# Patient Record
Sex: Male | Born: 1975
Health system: Southern US, Community
[De-identification: ages and names within clinical notes are randomized; demographics above are authoritative.]

## PROBLEM LIST (undated history)

## (undated) DIAGNOSIS — I471 Supraventricular tachycardia, unspecified: Secondary | ICD-10-CM

## (undated) DIAGNOSIS — E785 Hyperlipidemia, unspecified: Secondary | ICD-10-CM

## (undated) DIAGNOSIS — I10 Essential (primary) hypertension: Secondary | ICD-10-CM

## (undated) DIAGNOSIS — F419 Anxiety disorder, unspecified: Secondary | ICD-10-CM

## (undated) HISTORY — DX: Essential (primary) hypertension: I10

## (undated) HISTORY — DX: Supraventricular tachycardia, unspecified: I47.10

## (undated) HISTORY — DX: Supraventricular tachycardia: I47.1

## (undated) HISTORY — DX: Hyperlipidemia, unspecified: E78.5

---

## 2012-03-12 ENCOUNTER — Ambulatory Visit: Payer: Self-pay

## 2017-08-31 ENCOUNTER — Ambulatory Visit: Payer: Self-pay | Admitting: Family Medicine

## 2017-09-19 ENCOUNTER — Ambulatory Visit (INDEPENDENT_AMBULATORY_CARE_PROVIDER_SITE_OTHER): Payer: BLUE CROSS/BLUE SHIELD | Admitting: Family Medicine

## 2017-09-19 ENCOUNTER — Encounter: Payer: Self-pay | Admitting: Family Medicine

## 2017-09-19 VITALS — BP 155/115 | HR 88 | Temp 98.5°F | Ht 66.2 in | Wt 212.5 lb

## 2017-09-19 DIAGNOSIS — E782 Mixed hyperlipidemia: Secondary | ICD-10-CM | POA: Insufficient documentation

## 2017-09-19 DIAGNOSIS — R079 Chest pain, unspecified: Secondary | ICD-10-CM

## 2017-09-19 DIAGNOSIS — I10 Essential (primary) hypertension: Secondary | ICD-10-CM | POA: Diagnosis not present

## 2017-09-19 DIAGNOSIS — E669 Obesity, unspecified: Secondary | ICD-10-CM | POA: Diagnosis not present

## 2017-09-19 DIAGNOSIS — R002 Palpitations: Secondary | ICD-10-CM

## 2017-09-19 LAB — UA/M W/RFLX CULTURE, ROUTINE
BILIRUBIN UA: NEGATIVE
GLUCOSE, UA: NEGATIVE
Ketones, UA: NEGATIVE
NITRITE UA: NEGATIVE
Protein, UA: NEGATIVE
RBC, UA: NEGATIVE
SPEC GRAV UA: 1.015 (ref 1.005–1.030)
Urobilinogen, Ur: 0.2 mg/dL (ref 0.2–1.0)
pH, UA: 7 (ref 5.0–7.5)

## 2017-09-19 LAB — MICROALBUMIN, URINE WAIVED
Creatinine, Urine Waived: 100 mg/dL (ref 10–300)
Microalb, Ur Waived: 30 mg/L — ABNORMAL HIGH (ref 0–19)

## 2017-09-19 LAB — MICROSCOPIC EXAMINATION: Bacteria, UA: NONE SEEN

## 2017-09-19 LAB — BAYER DCA HB A1C WAIVED: HB A1C: 5.6 % (ref <7.0)

## 2017-09-19 MED ORDER — HYDROCHLOROTHIAZIDE 25 MG PO TABS: 25.0000 mg | ORAL_TABLET | Freq: Every day | ORAL | 1 refills | Status: DC

## 2017-09-19 MED ORDER — AMLODIPINE BESYLATE 10 MG PO TABS: 10.0000 mg | ORAL_TABLET | Freq: Every day | ORAL | 3 refills | Status: DC

## 2017-09-19 NOTE — Progress Notes (Signed)
BP (!) 155/115 (BP Location: Left Arm, Cuff Size: Large)   Pulse 88   Temp 98.5 F (36.9 C) (Oral)   Ht 5' 6.2" (1.681 m)   Wt 212 lb 8 oz (96.4 kg)   SpO2 92%   BMI 34.09 kg/m    Subjective:    Patient ID: Clarence Ray, male    DOB: 09/07/1975, 42 y.o.   MRN: [id number]  HPI: Clarence E Gambrel is a 42 y.o. male  Chief Complaint  Patient presents with  . Hypertension  . Hyperlipidemia  . Establish Care   HYPERTENSION / HYPERLIPIDEMIA- has been having issues with his BP for about 3 months. Hadn't been seeing a doctor prior to that. Has been concerned about his blood pressure medicines. Has been working on losing weight and exercise, but since going on the losartan. Has been gaining weight. Not happy about that.  Satisfied with current treatment? no Duration of hypertension: months BP monitoring frequency: a few times a week BP range: 120s-150s/80s-110s BP medication side effects: yes, weight gain, memory has been off  Past BP meds: losartan, hctz Duration of hyperlipidemia: unknown Cholesterol medication side effects: Not on anything Cholesterol supplements: none Past cholesterol medications: atorvastatin- stopped taking it the week after Thanksgiving- made him feel weird, heart race Medication compliance: excellent compliance Aspirin: no Recent stressors: no Recurrent headaches: yes Visual changes: no Palpitations: yes Dyspnea: no Chest pain: yes Lower extremity edema: no Dizzy/lightheaded: yes  Active Ambulatory Problems    Diagnosis Date Noted  . Hyperlipidemia   . Hypertension   . Obesity (BMI 30-39.9) 09/19/2017   Resolved Ambulatory Problems    Diagnosis Date Noted  . No Resolved Ambulatory Problems   Past Medical History:  Diagnosis Date  . Hyperlipidemia   . Hypertension    History reviewed. No pertinent surgical history.  Outpatient Encounter Medications as of 09/19/2017  Medication Sig  . hydrochlorothiazide (HYDRODIURIL) 25 MG tablet Take 1  tablet (25 mg total) by mouth daily.  . [DISCONTINUED] hydrochlorothiazide (HYDRODIURIL) 25 MG tablet Take 25 mg by mouth daily.  . [DISCONTINUED] losartan (COZAAR) 50 MG tablet Take 1 tablet by mouth daily.  Marland Kitchen. amLODipine (NORVASC) 10 MG tablet Take 1 tablet (10 mg total) by mouth daily.   No facility-administered encounter medications on file as of 09/19/2017.    Allergies  Allergen Reactions  . Atorvastatin Other (See Comments)    Made him "feel weird"  . Losartan Other (See Comments)    Made him "feel weird"    Family History  Problem Relation Age of Onset  . Heart disease Mother   . Hypertension Mother   . Fibromyalgia Sister   . Anxiety disorder Sister   . Stroke Paternal Grandmother   . Fibromyalgia Sister     Social History   Socioeconomic History  . Marital status: Married    Spouse name: Not on file  . Number of children: Not on file  . Years of education: Not on file  . Highest education level: Not on file  Social Needs  . Financial resource strain: Not on file  . Food insecurity - worry: Not on file  . Food insecurity - inability: Not on file  . Transportation needs - medical: Not on file  . Transportation needs - non-medical: Not on file  Occupational History  . Not on file  Tobacco Use  . Smoking status: Former Smoker    Last attempt to quit: 06/19/2017    Years since quitting: 0.2  .  Smokeless tobacco: Never Used  Substance and Sexual Activity  . Alcohol use: Yes    Comment: on occasion  . Drug use: No  . Sexual activity: Yes  Other Topics Concern  . Not on file  Social History Narrative  . Not on file     Review of Systems  Constitutional: Negative.   Respiratory: Positive for shortness of breath. Negative for apnea, cough, choking, chest tightness, wheezing and stridor.   Cardiovascular: Positive for chest pain and palpitations. Negative for leg swelling.  Neurological: Negative.     Per HPI unless specifically indicated above       Objective:    BP (!) 155/115 (BP Location: Left Arm, Cuff Size: Large)   Pulse 88   Temp 98.5 F (36.9 C) (Oral)   Ht 5' 6.2" (1.681 m)   Wt 212 lb 8 oz (96.4 kg)   SpO2 92%   BMI 34.09 kg/m   Wt Readings from Last 3 Encounters:  09/19/17 212 lb 8 oz (96.4 kg)    Physical Exam  Constitutional: He is oriented to person, place, and time. He appears well-developed and well-nourished. No distress.  HENT:  Head: Normocephalic and atraumatic.  Right Ear: Hearing normal.  Left Ear: Hearing normal.  Nose: Nose normal.  Eyes: Conjunctivae and lids are normal. Right eye exhibits no discharge. Left eye exhibits no discharge. No scleral icterus.  Cardiovascular: Normal rate, regular rhythm, normal heart sounds and intact distal pulses. Exam reveals no gallop and no friction rub.  No murmur heard. Pulmonary/Chest: Effort normal and breath sounds normal. No respiratory distress. He has no wheezes. He has no rales. He exhibits no tenderness.  Musculoskeletal: Normal range of motion.  Neurological: He is alert and oriented to person, place, and time.  Skin: Skin is warm, dry and intact. No rash noted. He is not diaphoretic. No erythema. No pallor.  Psychiatric: He has a normal mood and affect. His speech is normal and behavior is normal. Judgment and thought content normal. Cognition and memory are normal.  Nursing note and vitals reviewed.   No results found for this or any previous visit.    Assessment & Plan:   Problem List Items Addressed This Visit      Cardiovascular and Mediastinum   Hypertension - Primary    Not under good control, not tolerating losartan. Will change to amlodipine. Continue HCTZ and recheck 2 weeks. Will get renal US to R/O RAS given new dx and difficult to control. Await results.       Relevant Medications   amLODipine (NORVASC) 10 MG tablet   hydrochlorothiazide (HYDRODIURIL) 25 MG tablet   Other Relevant Orders   US Renal Artery Stenosis   CBC with  Differential/Platelet   Comprehensive metabolic panel   Microalbumin, Urine Waived   TSH   UA/M w/rflx Culture, Routine     Other   Hyperlipidemia    Stopped atorvastatin as it made him feel weird. Will recheck levels and treat as needed.       Relevant Medications   amLODipine (NORVASC) 10 MG tablet   hydrochlorothiazide (HYDRODIURIL) 25 MG tablet   Other Relevant Orders   CBC with Differential/Platelet   Comprehensive metabolic panel   Lipid Panel w/o Chol/HDL Ratio   TSH   UA/M w/rflx Culture, Routine   Obesity (BMI 30-39.9)    Encouraged diet and exercise. Call with any concerns.       Relevant Orders   CBC with Differential/Platelet   Bayer DCA Hb  A1c Waived   Comprehensive metabolic panel   TSH   UA/M w/rflx Culture, Routine    Other Visit Diagnoses    Palpitations       EKG showed no ST changes. Will check labs and obtain holter monitor. Get BP under control. Recheck 2 weeks.    Relevant Orders   EKG 12-Lead (Completed)   Holter monitor - 24 hour   CBC with Differential/Platelet   Comprehensive metabolic panel   TSH   UA/M w/rflx Culture, Routine   Chest pain, unspecified type       EKG showed no ST changes. Will obtain holter monitor. Get BP under control. Call with concerns- recheck 2 weeks.    Relevant Orders   EKG 12-Lead (Completed)   CBC with Differential/Platelet   Comprehensive metabolic panel   TSH   UA/M w/rflx Culture, Routine       Follow up plan: Return 2 weeks, for follow up BP.

## 2017-09-19 NOTE — Assessment & Plan Note (Signed)
Not under good control, not tolerating losartan. Will change to amlodipine. Continue HCTZ and recheck 2 weeks. Will get renal US to R/O RAS given new dx and difficult to control. Await results.

## 2017-09-19 NOTE — Assessment & Plan Note (Signed)
Stopped atorvastatin as it made him feel weird. Will recheck levels and treat as needed.

## 2017-09-19 NOTE — Assessment & Plan Note (Signed)
Encouraged diet and exercise. Call with any concerns.  

## 2017-09-19 NOTE — Patient Instructions (Addendum)
Managing Your Hypertension Hypertension is commonly called high blood pressure. This is when the force of your blood pressing against the walls of your arteries is too strong. Arteries are blood vessels that carry blood from your heart throughout your body. Hypertension forces the heart to work harder to pump blood, and may cause the arteries to become narrow or stiff. Having untreated or uncontrolled hypertension can cause heart attack, stroke, kidney disease, and other problems. What are blood pressure readings? A blood pressure reading consists of a higher number over a lower number. Ideally, your blood pressure should be below 120/80. The first ("top") number is called the systolic pressure. It is a measure of the pressure in your arteries as your heart beats. The second ("bottom") number is called the diastolic pressure. It is a measure of the pressure in your arteries as the heart relaxes. What does my blood pressure reading mean? Blood pressure is classified into four stages. Based on your blood pressure reading, your health care provider may use the following stages to determine what type of treatment you need, if any. Systolic pressure and diastolic pressure are measured in a unit called mm Hg. Normal  Systolic pressure: below 120.  Diastolic pressure: below 80. Elevated  Systolic pressure: 120-129.  Diastolic pressure: below 80. Hypertension stage 1  Systolic pressure: 130-139.  Diastolic pressure: 80-89. Hypertension stage 2  Systolic pressure: 140 or above.  Diastolic pressure: 90 or above. What health risks are associated with hypertension? Managing your hypertension is an important responsibility. Uncontrolled hypertension can lead to:  A heart attack.  A stroke.  A weakened blood vessel (aneurysm).  Heart failure.  Kidney damage.  Eye damage.  Metabolic syndrome.  Memory and concentration problems.  What changes can I make to manage my  hypertension? Hypertension can be managed by making lifestyle changes and possibly by taking medicines. Your health care provider will help you make a plan to bring your blood pressure within a normal range. Eating and drinking  Eat a diet that is high in fiber and potassium, and low in salt (sodium), added sugar, and fat. An example eating plan is called the DASH (Dietary Approaches to Stop Hypertension) diet. To eat this way: ? Eat plenty of fresh fruits and vegetables. Try to fill half of your plate at each meal with fruits and vegetables. ? Eat whole grains, such as whole wheat pasta, brown rice, or whole grain bread. Fill about one quarter of your plate with whole grains. ? Eat low-fat diary products. ? Avoid fatty cuts of meat, processed or cured meats, and poultry with skin. Fill about one quarter of your plate with lean proteins such as fish, chicken without skin, beans, eggs, and tofu. ? Avoid premade and processed foods. These tend to be higher in sodium, added sugar, and fat.  Reduce your daily sodium intake. Most people with hypertension should eat less than 1,500 mg of sodium a day.  Limit alcohol intake to no more than 1 drink a day for nonpregnant women and 2 drinks a day for men. One drink equals 12 oz of beer, 5 oz of wine, or 1 oz of hard liquor. Lifestyle  Work with your health care provider to maintain a healthy body weight, or to lose weight. Ask what an ideal weight is for you.  Get at least 30 minutes of exercise that causes your heart to beat faster (aerobic exercise) most days of the week. Activities may include walking, swimming, or biking.  Include exercise   to strengthen your muscles (resistance exercise), such as weight lifting, as part of your weekly exercise routine. Try to do these types of exercises for 30 minutes at least 3 days a week.  Do not use any products that contain nicotine or tobacco, such as cigarettes and e-cigarettes. If you need help quitting, ask  your health care provider.  Control any long-term (chronic) conditions you have, such as high cholesterol or diabetes. Monitoring  Monitor your blood pressure at home as told by your health care provider. Your personal target blood pressure may vary depending on your medical conditions, your age, and other factors.  Have your blood pressure checked regularly, as often as told by your health care provider. Working with your health care provider  Review all the medicines you take with your health care provider because there may be side effects or interactions.  Talk with your health care provider about your diet, exercise habits, and other lifestyle factors that may be contributing to hypertension.  Visit your health care provider regularly. Your health care provider can help you create and adjust your plan for managing hypertension. Will I need medicine to control my blood pressure? Your health care provider may prescribe medicine if lifestyle changes are not enough to get your blood pressure under control, and if:  Your systolic blood pressure is 130 or higher.  Your diastolic blood pressure is 80 or higher.  Take medicines only as told by your health care provider. Follow the directions carefully. Blood pressure medicines must be taken as prescribed. The medicine does not work as well when you skip doses. Skipping doses also puts you at risk for problems. Contact a health care provider if:  You think you are having a reaction to medicines you have taken.  You have repeated (recurrent) headaches.  You feel dizzy.  You have swelling in your ankles.  You have trouble with your vision. Get help right away if:  You develop a severe headache or confusion.  You have unusual weakness or numbness, or you feel faint.  You have severe pain in your chest or abdomen.  You vomit repeatedly.  You have trouble breathing. Summary  Hypertension is when the force of blood pumping through  your arteries is too strong. If this condition is not controlled, it may put you at risk for serious complications.  Your personal target blood pressure may vary depending on your medical conditions, your age, and other factors. For most people, a normal blood pressure is less than 120/80.  Hypertension is managed by lifestyle changes, medicines, or both. Lifestyle changes include weight loss, eating a healthy, low-sodium diet, exercising more, and limiting alcohol. This information is not intended to replace advice given to you by your health care provider. Make sure you discuss any questions you have with your health care provider. Document Released: 05/09/2012 Document Revised: 07/13/2016 Document Reviewed: 07/13/2016 Elsevier Interactive Patient Education  2018 [employer]  Renovascular Hypertension Renovascular hypertension is high blood pressure that happens when the arteries that carry blood to the kidneys (renal arteries) become narrow. Blood pressure is a measurement of how strongly your blood is pressing against the walls of your arteries. Hypertension, or high blood pressure, is a long-term condition in which blood pressure is higher than normal. Untreated hypertension, including renovascular hypertension, can lead to various health problems, such as:  Heart failure.  Heart disease.  Stroke.  Kidney failure.  Blood vessel damage.  Blindness or vision problems.  What are the causes? This  condition occurs when one or both of the renal arteries become narrow. This narrowing reduces blood flow to the kidneys, causing the kidneys to sense that blood pressure is low. As a result, the kidneys make an enzyme called renin that causes an increase in blood pressure. Many conditions can cause the renal arteries to become narrow and lead to renovascular hypertension. Some of these are:  Atherosclerosis. This is a hardening of the renal arteries. It causes plaque to build up and block  the renal arteries.  Fibromuscular dysplasia. This is a condition in which cells of the artery wall overgrow, causing a narrowing of the renal arteries. It is a common cause of renovascular hypertension in younger women.  A blockage in the renal artery due to injury, tumors, or blood clots (rare).  What increases the risk? You are more likely to develop this condition if:  You are a woman who is younger than age 25.  You are a man who is older than age 78.  You have a history of heart problems or strokes.  What are the signs or symptoms? In many cases, there are no symptoms. If symptoms are present, they may include:  Sudden high blood pressure that gets worse in older people who previously had well-controlled blood pressure.  Nausea and vomiting.  Vision problems.  Chest pain.  How is this diagnosed? This condition may be diagnosed based on:  A physical exam and blood pressure check. During the exam, your health care provider may use a stethoscope to listen for a "whooshing" noise (bruit) over the abdomen or on either side between the ribs and the hip (flank area).  Blood tests to measure renin and to check your hormone levels, including a hormone called aldosterone. Aldosterone controls the salt and water balance in your body.  Imaging tests. These may include: ? An ultrasound. This test uses sound waves to produce an image of the inside of your body. ? Renal angiogram. For this test, a dye is injected into a kidney artery to show narrowing of the artery on an X-ray. ? MRI of the arteries that supply the kidneys.  How is this treated? This condition may be treated with:  Medicines. These may be given to help you control your blood pressure.  Treatments or lifestyle changes to address factors or conditions that may be contributing to high blood pressure. This may mean lowering your cholesterol, eating a heart-healthy diet, exercising, and maintaining an ideal body  weight.  Surgery to remove a blockage. This may be necessary if a renal artery is blocked badly.  Percutaneous transluminal renal angioplasty (PTRA). This is a procedure to open narrow renal arteries if they are not completely blocked. Sometimes a stent is placed in the artery to prevent the artery from becoming blocked again.  Follow these instructions at home: Monitoring your blood pressure  Monitor your blood pressure at home as told by your health care provider. ? A blood pressure reading is recorded as two numbers, such as 120 over 80 (or 120/80). The first ("top") number is called the systolic pressure. It is a measure of the pressure in your arteries when your heart beats. The second ("bottom") number is called the diastolic pressure. It is a measure of the pressure in your arteries as your heart relaxes between beats. A normal blood pressure reading is:  Systolic: below 120.  Diastolic: below 80. ? Your personal target blood pressure may vary depending on your medical conditions, your age, and other  factors.  Have your blood pressure rechecked as told by your health care provider. Lifestyle  Work with your health care provider to maintain a healthy body weight or to lose weight. Ask what an ideal weight is for you.  Exercise regularly. Get at least 30-45 minutes of aerobic exercise, at least 5 times a week.  Do not use any products that contain nicotine or tobacco, such as cigarettes and e-cigarettes. If you need help quitting, ask your health care provider. Eating and drinking  Eat a heart-healthy diet. This may include: ? Following the DASH diet. This diet is high in fruits, vegetables, and whole grains. It is low in salt (sodium), saturated fat, and added sugars. ? Keeping your sodium intake below 1,500 mg per day. Do not add salt to your food. Check food labels to see how much sodium is in a food or beverage.  Limit alcohol intake to no more than 1 drink a day for  nonpregnant women and 2 drinks a day for men. One drink equals 12 oz of beer, 5 oz of wine, or 1 oz of hard liquor.  Try to limit caffeine. Caffeine may make your renovascular hypertension worse. Check ingredients and nutrition facts to see if a food or beverage contains caffeine. General instructions  Take over-the-counter and prescription medicines only as told by your health care provider.  Keep all follow-up visits as told by your health care provider. This is important. Contact a health care provider if:  Your symptoms continue to get worse and medicine does not help.  You have a fever. Get help right away if:  You develop shortness of breath.  You develop numbness on one side.  You develop areas of muscle weakness.  You are unable to speak.  You feel light-headed or you pass out.  You have sudden spikes of high blood pressure.  You have symptoms of very high blood pressure. Summary  Renovascular hypertension is high blood pressure that happens when the arteries that carry blood to the kidneys (renal arteries) become narrow.  In many cases, there are no symptoms for this condition.  There are several treatments for renovascular hypertension, including medicines, lifestyle changes, surgery to remove a blockage, or a procedure to widen a narrow artery.  Monitor your blood pressure at home as told by your health care provider. This information is not intended to replace advice given to you by your health care provider. Make sure you discuss any questions you have with your health care provider. Document Released: 07/29/2005 Document Revised: 07/20/2016 Document Reviewed: 07/20/2016 Elsevier Interactive Patient Education  2017 [employer] Amlodipine tablets What is this medicine? AMLODIPINE (am LOE di peen) is a calcium-channel blocker. It affects the amount of calcium found in your heart and muscle cells. This relaxes your blood vessels, which can reduce the amount of  work the heart has to do. This medicine is used to lower high blood pressure. It is also used to prevent chest pain. This medicine may be used for other purposes; ask your health care provider or pharmacist if you have questions. COMMON BRAND NAME(S): Norvasc What should I tell my health care provider before I take this medicine? They need to know if you have any of these conditions: -heart problems like heart failure or aortic stenosis -liver disease -an unusual or allergic reaction to amlodipine, other medicines, foods, dyes, or preservatives -pregnant or trying to get pregnant -breast-feeding How should I use this medicine? Take this medicine by mouth with a  glass of water. Follow the directions on the prescription label. Take your medicine at regular intervals. Do not take more medicine than directed. Talk to your pediatrician regarding the use of this medicine in children. Special care may be needed. This medicine has been used in children as young as 6. Persons over 15 years old may have a stronger reaction to this medicine and need smaller doses. Overdosage: If you think you have taken too much of this medicine contact a poison control center or emergency room at once. NOTE: This medicine is only for you. Do not share this medicine with others. What if I miss a dose? If you miss a dose, take it as soon as you can. If it is almost time for your next dose, take only that dose. Do not take double or extra doses. What may interact with this medicine? -herbal or dietary supplements -local or general anesthetics -medicines for high blood pressure -medicines for prostate problems -rifampin This list may not describe all possible interactions. Give your health care provider a list of all the medicines, herbs, non-prescription drugs, or dietary supplements you use. Also tell them if you smoke, drink alcohol, or use illegal drugs. Some items may interact with your medicine. What should I watch  for while using this medicine? Visit your doctor or health care professional for regular check ups. Check your blood pressure and pulse rate regularly. Ask your health care professional what your blood pressure and pulse rate should be, and when you should contact him or her. This medicine may make you feel confused, dizzy or lightheaded. Do not drive, use machinery, or do anything that needs mental alertness until you know how this medicine affects you. To reduce the risk of dizzy or fainting spells, do not sit or stand up quickly, especially if you are an older patient. Avoid alcoholic drinks; they can make you more dizzy. Do not suddenly stop taking amlodipine. Ask your doctor or health care professional how you can gradually reduce the dose. What side effects may I notice from receiving this medicine? Side effects that you should report to your doctor or health care professional as soon as possible: -allergic reactions like skin rash, itching or hives, swelling of the face, lips, or tongue -breathing problems -changes in vision or hearing -chest pain -fast, irregular heartbeat -swelling of legs or ankles Side effects that usually do not require medical attention (report to your doctor or health care professional if they continue or are bothersome): -dry mouth -facial flushing -nausea, vomiting -stomach gas, pain -tired, weak -trouble sleeping This list may not describe all possible side effects. Call your doctor for medical advice about side effects. You may report side effects to FDA at 1-800-FDA-1088. Where should I keep my medicine? Keep out of the reach of children. Store at room temperature between 59 and 86 degrees F (15 and 30 degrees C). Protect from light. Keep container tightly closed. Throw away any unused medicine after the expiration date. NOTE: This sheet is a summary. It may not cover all possible information. If you have questions about this medicine, talk to your doctor,  pharmacist, or health care provider.  2018 Elsevier/Gold Standard (2012-07-13 11:40:58)

## 2017-09-20 ENCOUNTER — Encounter: Payer: Self-pay | Admitting: Family Medicine

## 2017-09-20 LAB — CBC WITH DIFFERENTIAL/PLATELET
BASOS: 1 %
Basophils Absolute: 0 10*3/uL (ref 0.0–0.2)
EOS (ABSOLUTE): 0.2 10*3/uL (ref 0.0–0.4)
Eos: 3 %
Hematocrit: 46.2 % (ref 37.5–51.0)
Hemoglobin: 16 g/dL (ref 13.0–17.7)
Immature Grans (Abs): 0 10*3/uL (ref 0.0–0.1)
Immature Granulocytes: 0 %
Lymphocytes Absolute: 2.4 10*3/uL (ref 0.7–3.1)
Lymphs: 43 %
MCH: 31 pg (ref 26.6–33.0)
MCHC: 34.6 g/dL (ref 31.5–35.7)
MCV: 90 fL (ref 79–97)
MONOS ABS: 0.5 10*3/uL (ref 0.1–0.9)
Monocytes: 9 %
NEUTROS ABS: 2.5 10*3/uL (ref 1.4–7.0)
Neutrophils: 44 %
PLATELETS: 200 10*3/uL (ref 150–379)
RBC: 5.16 x10E6/uL (ref 4.14–5.80)
RDW: 14.1 % (ref 12.3–15.4)
WBC: 5.7 10*3/uL (ref 3.4–10.8)

## 2017-09-20 LAB — COMPREHENSIVE METABOLIC PANEL
ALT: 18 IU/L (ref 0–44)
AST: 15 IU/L (ref 0–40)
Albumin/Globulin Ratio: 1.8 (ref 1.2–2.2)
Albumin: 4.6 g/dL (ref 3.5–5.5)
Alkaline Phosphatase: 77 IU/L (ref 39–117)
BILIRUBIN TOTAL: 0.2 mg/dL (ref 0.0–1.2)
BUN/Creatinine Ratio: 12 (ref 9–20)
BUN: 12 mg/dL (ref 6–24)
CHLORIDE: 100 mmol/L (ref 96–106)
CO2: 27 mmol/L (ref 20–29)
Calcium: 9.3 mg/dL (ref 8.7–10.2)
Creatinine, Ser: 0.99 mg/dL (ref 0.76–1.27)
GFR calc non Af Amer: 95 mL/min/{1.73_m2} (ref >59)
GFR, EST AFRICAN AMERICAN: 110 mL/min/{1.73_m2} (ref >59)
Globulin, Total: 2.6 g/dL (ref 1.5–4.5)
Glucose: 114 mg/dL — ABNORMAL HIGH (ref 65–99)
POTASSIUM: 4.3 mmol/L (ref 3.5–5.2)
SODIUM: 142 mmol/L (ref 134–144)
TOTAL PROTEIN: 7.2 g/dL (ref 6.0–8.5)

## 2017-09-20 LAB — TSH: TSH: 0.806 u[IU]/mL (ref 0.450–4.500)

## 2017-09-20 LAB — LIPID PANEL W/O CHOL/HDL RATIO
CHOLESTEROL TOTAL: 194 mg/dL (ref 100–199)
HDL: 31 mg/dL — ABNORMAL LOW (ref >39)
LDL CALC: 127 mg/dL — AB (ref 0–99)
TRIGLYCERIDES: 180 mg/dL — AB (ref 0–149)
VLDL CHOLESTEROL CAL: 36 mg/dL (ref 5–40)

## 2017-09-22 NOTE — Telephone Encounter (Signed)
Please call [place] and see how he's doing- that is not an appropriate mychart message- for symptoms like that, he really should call. Thanks.

## 2017-09-25 NOTE — Telephone Encounter (Signed)
Called and left patient a VM asking for him to please return my call.  

## 2017-09-26 NOTE — Telephone Encounter (Signed)
Called patient, he states that he is feeling better. Asked patient that if this happens in the future to contact the office to schedule a follow up visit.

## 2017-10-04 ENCOUNTER — Encounter: Payer: Self-pay | Admitting: Family Medicine

## 2017-10-04 ENCOUNTER — Ambulatory Visit (INDEPENDENT_AMBULATORY_CARE_PROVIDER_SITE_OTHER): Payer: BLUE CROSS/BLUE SHIELD | Admitting: Family Medicine

## 2017-10-04 VITALS — BP 126/86 | HR 100 | Temp 98.8°F | Wt 213.4 lb

## 2017-10-04 DIAGNOSIS — I10 Essential (primary) hypertension: Secondary | ICD-10-CM | POA: Diagnosis not present

## 2017-10-04 DIAGNOSIS — R002 Palpitations: Secondary | ICD-10-CM

## 2017-10-04 NOTE — Patient Instructions (Addendum)
Palpitations A palpitation is the feeling that your heartbeat is irregular or is faster than normal. It may feel like your heart is fluttering or skipping a beat. Palpitations are usually not a serious problem. They may be caused by many things, including smoking, caffeine, alcohol, stress, and certain medicines. Although most causes of palpitations are not serious, palpitations can be a sign of a serious medical problem. In some cases, you may need further medical evaluation. Follow these instructions at home: Pay attention to any changes in your symptoms. Take these actions to help with your condition:  Avoid the following: ? Caffeinated coffee, tea, soft drinks, diet pills, and energy drinks. ? Chocolate. ? Alcohol.  Do not use any tobacco products, such as cigarettes, chewing tobacco, and e-cigarettes. If you need help quitting, ask your health care provider.  Try to reduce your stress and anxiety. Things that can help you relax include: ? Yoga. ? Meditation. ? Physical activity, such as swimming, jogging, or walking. ? Biofeedback. This is a method that helps you learn to use your mind to control things in your body, such as your heartbeats.  Get plenty of rest and sleep.  Take over-the-counter and prescription medicines only as told by your health care provider.  Keep all follow-up visits as told by your health care provider. This is important.  Contact a health care provider if:  You continue to have a fast or irregular heartbeat after 24 hours.  Your palpitations occur more often. Get help right away if:  You have chest pain or shortness of breath.  You have a severe headache.  You feel dizzy or you faint. This information is not intended to replace advice given to you by your health care provider. Make sure you discuss any questions you have with your health care provider. Document Released: 08/12/2000 Document Revised: 01/18/2016 Document Reviewed: 04/30/2015 Elsevier  Interactive Patient Education  2018 [employer] Inc.  

## 2017-10-04 NOTE — Assessment & Plan Note (Addendum)
Under good control with current regimen. Continue current regimen. Call with any concerns. Refills given. Having US for ?RAS next Friday.

## 2017-10-04 NOTE — Progress Notes (Signed)
BP 126/86 (BP Location: Left Arm, Cuff Size: Normal)   Pulse 100   Temp 98.8 F (37.1 C)   Wt 213 lb 6 oz (96.8 kg)   SpO2 93%   BMI 34.23 kg/m    Subjective:    Patient ID: Clarence Ray, male    DOB: [birth year], 41 y.o.   MRN: [id number]  HPI: Clarence Ray is a 42 y.o. male  Chief Complaint  Patient presents with  . Hypertension   HYPERTENSION Hypertension status: better  Satisfied with current treatment? no Duration of hypertension: chronic BP monitoring frequency:  a few times a week 120-130s/80-90s BP medication side effects:  no Medication compliance: good compliance Previous BP meds: amlodipine and hctz Aspirin: no Recurrent headaches: no Visual changes: no Palpitations: yes Dyspnea: no Chest pain: no Lower extremity edema: no Dizzy/lightheaded: yes  PALPITATIONS Duration: months Symptom description: heart starts pounding and sometimes skips a beat Duration of episode: minutes Frequency: recurrentl Activity when event occurred: at random. Not associated with anything Related to exertion: no Dyspnea: yes Chest pain: no Syncope: no Anxiety/stress: yes Nausea/vomiting: no Diaphoresis: no Coronary artery disease: no Congestive heart failure: no Arrhythmia:no Thyroid disease: no Status:  fluctuating Treatments attempted:none  Relevant past medical, surgical, family and social history reviewed and updated as indicated. Interim medical history since our last visit reviewed. Allergies and medications reviewed and updated.  Review of Systems  Constitutional: Negative.   Respiratory: Negative.   Cardiovascular: Positive for palpitations. Negative for chest pain and leg swelling.  Gastrointestinal: Negative.   Neurological: Negative.   Psychiatric/Behavioral: Negative.     Per HPI unless specifically indicated above     Objective:    BP 126/86 (BP Location: Left Arm, Cuff Size: Normal)   Pulse 100   Temp 98.8 F (37.1 C)   Wt 213 lb 6  oz (96.8 kg)   SpO2 93%   BMI 34.23 kg/m   Wt Readings from Last 3 Encounters:  10/04/17 213 lb 6 oz (96.8 kg)  09/19/17 212 lb 8 oz (96.4 kg)    Physical Exam  Constitutional: He is oriented to person, place, and time. He appears well-developed and well-nourished. No distress.  HENT:  Head: Normocephalic and atraumatic.  Right Ear: Hearing normal.  Left Ear: Hearing normal.  Nose: Nose normal.  Eyes: Conjunctivae and lids are normal. Right eye exhibits no discharge. Left eye exhibits no discharge. No scleral icterus.  Cardiovascular: Normal rate, regular rhythm, normal heart sounds and intact distal pulses. Exam reveals no gallop and no friction rub.  No murmur heard. Pulmonary/Chest: Effort normal and breath sounds normal. No respiratory distress. He has no wheezes. He has no rales. He exhibits no tenderness.  Musculoskeletal: Normal range of motion.  Neurological: He is alert and oriented to person, place, and time.  Skin: Skin is warm, dry and intact. No rash noted. He is not diaphoretic. No erythema. No pallor.  Psychiatric: He has a normal mood and affect. His speech is normal and behavior is normal. Judgment and thought content normal. Cognition and memory are normal.  Nursing note and vitals reviewed.   Results for orders placed or performed in visit on 09/19/17  Microscopic Examination  Result Value Ref Range   WBC, UA 0-5 0 - 5 /hpf   RBC, UA 0-2 0 - 2 /hpf   Epithelial Cells (non renal) 0-10 0 - 10 /hpf   Bacteria, UA None seen None seen/Few  CBC with Differential/Platelet  Result Value Ref Range  WBC 5.7 3.4 - 10.8 x10E3/uL   RBC 5.16 4.14 - 5.80 x10E6/uL   Hemoglobin 16.0 13.0 - 17.7 g/dL   Hematocrit 16.1 09.6 - 51.0 %   MCV 90 79 - 97 fL   MCH 31.0 26.6 - 33.0 pg   MCHC 34.6 31.5 - 35.7 g/dL   RDW 04.5 40.9 - 81.1 %   Platelets 200 150 - 379 x10E3/uL   Neutrophils 44 Not Estab. %   Lymphs 43 Not Estab. %   Monocytes 9 Not Estab. %   Eos 3 Not Estab. %    Basos 1 Not Estab. %   Neutrophils Absolute 2.5 1.4 - 7.0 x10E3/uL   Lymphocytes Absolute 2.4 0.7 - 3.1 x10E3/uL   Monocytes Absolute 0.5 0.1 - 0.9 x10E3/uL   EOS (ABSOLUTE) 0.2 0.0 - 0.4 x10E3/uL   Basophils Absolute 0.0 0.0 - 0.2 x10E3/uL   Immature Granulocytes 0 Not Estab. %   Immature Grans (Abs) 0.0 0.0 - 0.1 x10E3/uL  Bayer DCA Hb A1c Waived  Result Value Ref Range   Bayer DCA Hb A1c Waived 5.6 <7.0 %  Comprehensive metabolic panel  Result Value Ref Range   Glucose 114 (H) 65 - 99 mg/dL   BUN 12 6 - 24 mg/dL   Creatinine, Ser 9.14 0.76 - 1.27 mg/dL   GFR calc non Af Amer 95 >59 mL/min/1.73   GFR calc Af Amer 110 >59 mL/min/1.73   BUN/Creatinine Ratio 12 9 - 20   Sodium 142 134 - 144 mmol/L   Potassium 4.3 3.5 - 5.2 mmol/L   Chloride 100 96 - 106 mmol/L   CO2 27 20 - 29 mmol/L   Calcium 9.3 8.7 - 10.2 mg/dL   Total Protein 7.2 6.0 - 8.5 g/dL   Albumin 4.6 3.5 - 5.5 g/dL   Globulin, Total 2.6 1.5 - 4.5 g/dL   Albumin/Globulin Ratio 1.8 1.2 - 2.2   Bilirubin Total 0.2 0.0 - 1.2 mg/dL   Alkaline Phosphatase 77 39 - 117 IU/L   AST 15 0 - 40 IU/L   ALT 18 0 - 44 IU/L  Lipid Panel w/o Chol/HDL Ratio  Result Value Ref Range   Cholesterol, Total 194 100 - 199 mg/dL   Triglycerides 782 (H) 0 - 149 mg/dL   HDL 31 (L) >95 mg/dL   VLDL Cholesterol Cal 36 5 - 40 mg/dL   LDL Calculated 621 (H) 0 - 99 mg/dL  Microalbumin, Urine Waived  Result Value Ref Range   Microalb, Ur Waived 30 (H) 0 - 19 mg/L   Creatinine, Urine Waived 100 10 - 300 mg/dL   Microalb/Creat Ratio <30 <30 mg/g  TSH  Result Value Ref Range   TSH 0.806 0.450 - 4.500 uIU/mL  UA/M w/rflx Culture, Routine  Result Value Ref Range   Specific Gravity, UA 1.015 1.005 - 1.030   pH, UA 7.0 5.0 - 7.5   Color, UA Yellow Yellow   Appearance Ur Clear Clear   Leukocytes, UA Trace (A) Negative   Protein, UA Negative Negative/Trace   Glucose, UA Negative Negative   Ketones, UA Negative Negative   RBC, UA Negative  Negative   Bilirubin, UA Negative Negative   Urobilinogen, Ur 0.2 0.2 - 1.0 mg/dL   Nitrite, UA Negative Negative   Microscopic Examination See below:       Assessment & Plan:   Problem List Items Addressed This Visit      Cardiovascular and Mediastinum   Hypertension - Primary    Under  good control with current regimen. Continue current regimen. Call with any concerns. Refills given. Having US for ?RAS next Friday.      Relevant Orders   Basic metabolic panel    Other Visit Diagnoses    Palpitations       Not significantly better with control of BP. Making him very nervous. He would like to see cardiology. Referral generated today.   Relevant Orders   Ambulatory referral to Cardiology       Follow up plan: Return in about 6 months (around 04/03/2018) for Physical.

## 2017-10-05 LAB — BASIC METABOLIC PANEL
BUN/Creatinine Ratio: 14 (ref 9–20)
BUN: 14 mg/dL (ref 6–24)
CALCIUM: 10.1 mg/dL (ref 8.7–10.2)
CHLORIDE: 99 mmol/L (ref 96–106)
CO2: 24 mmol/L (ref 20–29)
Creatinine, Ser: 1.01 mg/dL (ref 0.76–1.27)
GFR calc Af Amer: 107 mL/min/{1.73_m2} (ref >59)
GFR, EST NON AFRICAN AMERICAN: 93 mL/min/{1.73_m2} (ref >59)
Glucose: 108 mg/dL — ABNORMAL HIGH (ref 65–99)
POTASSIUM: 4.3 mmol/L (ref 3.5–5.2)
Sodium: 142 mmol/L (ref 134–144)

## 2017-10-13 ENCOUNTER — Ambulatory Visit
Admission: RE | Admit: 2017-10-13 | Discharge: 2017-10-13 | Disposition: A | Discharge status: Home / Self Care | Payer: BLUE CROSS/BLUE SHIELD | Source: Ambulatory Visit | Attending: Family Medicine | Admitting: Family Medicine

## 2017-10-13 ENCOUNTER — Other Ambulatory Visit: Payer: Self-pay | Admitting: Orthopedic Surgery

## 2017-10-13 DIAGNOSIS — I150 Renovascular hypertension: Secondary | ICD-10-CM | POA: Diagnosis not present

## 2017-10-13 DIAGNOSIS — I10 Essential (primary) hypertension: Secondary | ICD-10-CM | POA: Insufficient documentation

## 2017-11-13 ENCOUNTER — Ambulatory Visit: Payer: Self-pay | Admitting: Cardiovascular Disease

## 2017-11-29 DIAGNOSIS — R002 Palpitations: Secondary | ICD-10-CM | POA: Insufficient documentation

## 2017-11-29 NOTE — Progress Notes (Signed)
Cardiology Office Note  Date:  11/30/2017   ID:  Clarence Ray, DOB Jun 01, 1976, MRN [id number]  PCP:  Dorcas Carrow, DO   Chief Complaint  Patient presents with  . other    Ref by Dr. Laural Benes for chest pain and palpitations. Meds reviewed by the pt. verbally.     HPI:  Mr. Clarence Ray is a 42 year old gentleman with past medical history of Hx of ETOH abuse   smoking ( quit 6 months ago) in the past year Hypertension Palpitations Referred by Olevia Perches for consultation of his palpitations  Weight up from 192 to 212 in the past year Blames weight gain on losartan  Total cholesterol 194 LDL 127  Renal artery ultrasound with no stenosis  Long hx of palpitations, since childhood, Occasional skipped beats  Since September has started to have episodes of tachycardia  commonly associated when he drinks heavily  episodes are severe, described as a pounding, Rapid Last on Sunday (was drinking heavy on Sat and Sunday) rate 136, SOB >30 min, layed in bed  After eating/talking, head clogging  Also reports having some chronic SOB, fatigue, tired Worse with stairs  Was on losartan last year, changed to amlodipine Weight went up  In September reported having severe episode of tachycardia  in the setting of ETOH Significant shortness of breath Arm tingling, emts came,  Tachycardia resolved by the time EMTs arrived  EKG personally reviewed by myself on todays visit Shows normal sinus rhythm rate 87 bpm no significant ST or T wave changes   PMH:   has a past medical history of Hyperlipidemia and Hypertension.  ETOH abuse, former smoker  PSH:   History reviewed. No pertinent surgical history.  Current Outpatient Medications  Medication Sig Dispense Refill  . amLODipine (NORVASC) 10 MG tablet Take 1 tablet (10 mg total) by mouth daily. 30 tablet 3  . hydrochlorothiazide (HYDRODIURIL) 25 MG tablet Take 1 tablet (25 mg total) by mouth daily. 90 tablet 1   No current  facility-administered medications for this visit.      Allergies:   Atorvastatin and Losartan   Social History:  The patient  reports that he quit smoking about 5 months ago. He has never used smokeless tobacco. He reports that he drinks alcohol. He reports that he does not use drugs.   Family History:   family history includes Anxiety disorder in his sister; Fibromyalgia in his sister and sister; Heart disease in his mother; Hypertension in his mother; Stroke in his paternal grandmother.    Review of Systems: Review of Systems  Constitutional: Negative.   Respiratory: Negative.   Cardiovascular: Negative.   Gastrointestinal: Negative.   Musculoskeletal: Negative.   Neurological: Negative.   Psychiatric/Behavioral: Negative.   All other systems reviewed and are negative.    PHYSICAL EXAM: VS:  BP (!) 136/102 (BP Location: Left Arm, Patient Position: Sitting, Cuff Size: Normal)   Pulse 87   Ht 5\' 6"  (1.676 m)   Wt 212 lb 12 oz (96.5 kg)   BMI 34.34 kg/m  , BMI Body mass index is 34.34 kg/m. GEN: Well nourished, well developed, in no acute distress  HEENT: normal  Neck: no JVD, carotid bruits, or masses Cardiac: RRR; no murmurs, rubs, or gallops,no edema  Respiratory:  clear to auscultation bilaterally, normal work of breathing GI: soft, nontender, nondistended, + BS MS: no deformity or atrophy  Skin: warm and dry, no rash Neuro:  Strength and sensation are intact Psych: euthymic mood, full affect  Recent Labs: 09/19/2017: ALT 18; Hemoglobin 16.0; Platelets 200; TSH 0.806 10/04/2017: BUN 14; Creatinine, Ser 1.01; Potassium 4.3; Sodium 142    Lipid Panel Lab Results  Component Value Date   CHOL 194 09/19/2017   HDL 31 (L) 09/19/2017   LDLCALC 127 (H) 09/19/2017   TRIG 180 (H) 09/19/2017      Wt Readings from Last 3 Encounters:  11/30/17 212 lb 12 oz (96.5 kg)  10/04/17 213 lb 6 oz (96.8 kg)  09/19/17 212 lb 8 oz (96.4 kg)       ASSESSMENT AND  PLAN:  Paroxysmal tachycardia (HCC) Concerning for atrial tachycardia or SVT Seems to be triggered by heavy alcohol abuse Reports drinking beer and hard alcohol On these occasions will develop at least 30 minutes of profound tachycardia with associated shortness of breath Recommended alcohol cessation Event monitor placed  we will change his amlodipine to diltiazem 120 mg twice daily extended release after his event monitor is complete in 2 weeks  Palpitations - Plan: EKG 12-Lead Details as above Likely having paroxysmal atrial tachycardia or SVT associated with heavy alcohol use  Essential hypertension Medication changes as above after his event monitor is complete Alcohol abuse  Former smoker We have encouraged him to continue to work on weaning his cigarettes and smoking cessation. He will continue to work on this and does not want any assistance with chantix.   Mixed hyperlipidemia  weight is up 20 pounds, recommended low carbohydrate diet, less alcohol   Alcohol abuse Tachycardia palpitations seem to present after heavy alcohol abuse including beer, hard alcohol .  Recommended he try alcohol cessation for now  Disposition:   F/U  2 months   Total encounter time more than 60 minutes  Greater than 50% was spent in counseling and coordination of care with the patient  Patient was seen in consultation for Dr. Olevia PerchesMegan Johnson and will be referred back to her office for ongoing care of the issues detailed above   Orders Placed This Encounter  Procedures  . EKG 12-Lead     Signed, Dossie Arbourim Charlotta Lapaglia, M.D., Ph.D. 11/30/2017  [facility], [place] [phone]

## 2017-11-30 ENCOUNTER — Ambulatory Visit: Payer: BLUE CROSS/BLUE SHIELD | Admitting: Cardiovascular Disease

## 2017-11-30 ENCOUNTER — Encounter: Payer: Self-pay | Admitting: Cardiovascular Disease

## 2017-11-30 ENCOUNTER — Ambulatory Visit (INDEPENDENT_AMBULATORY_CARE_PROVIDER_SITE_OTHER): Payer: BLUE CROSS/BLUE SHIELD

## 2017-11-30 VITALS — BP 136/102 | HR 87 | Ht 66.0 in | Wt 212.8 lb

## 2017-11-30 DIAGNOSIS — I10 Essential (primary) hypertension: Secondary | ICD-10-CM

## 2017-11-30 DIAGNOSIS — I471 Supraventricular tachycardia: Secondary | ICD-10-CM | POA: Insufficient documentation

## 2017-11-30 DIAGNOSIS — F1010 Alcohol abuse, uncomplicated: Secondary | ICD-10-CM | POA: Diagnosis not present

## 2017-11-30 DIAGNOSIS — R002 Palpitations: Secondary | ICD-10-CM

## 2017-11-30 DIAGNOSIS — E782 Mixed hyperlipidemia: Secondary | ICD-10-CM | POA: Diagnosis not present

## 2017-11-30 DIAGNOSIS — I479 Paroxysmal tachycardia, unspecified: Secondary | ICD-10-CM | POA: Diagnosis not present

## 2017-11-30 DIAGNOSIS — Z87891 Personal history of nicotine dependence: Secondary | ICD-10-CM

## 2017-11-30 NOTE — Patient Instructions (Addendum)
Medication Instructions:   After two weeks stop the amlodipine Start the diltiazem ER 120 mg twice a day  Labwork:  No new labs needed  Testing/Procedures:  We will order a zio monitor for tachycardia  You are probably having: SVT - supra ventricular tachycardia Or Atrial tachycardia   Follow-Up: It was a pleasure seeing you in the office today. Please call us if you have new issues that need to be addressed before your next appt.  [phone]  Your physician wants you to follow-up in:  Follow up 2 to 3 months   If you need a refill on your cardiac medications before your next appointment, please call your pharmacy.  For educational health videos Log in to : [email or web address] Or : [email or web address], password : [email or web address]

## 2017-12-04 ENCOUNTER — Telehealth: Payer: Self-pay | Admitting: Cardiovascular Disease

## 2017-12-04 NOTE — Telephone Encounter (Signed)
Spoke with patient and he had questions about his monitor. He reports that he is almost out of the booklet entries and advised that he can put on separate sheet of paper if needed. He verbalized understanding of instructions and advised that someone would call to schedule appointment with Dr. Mariah MillingGollan in 2-3 months. He verbalized understanding of our conversation, agreement with plan, and had no further questions at this time.

## 2017-12-04 NOTE — Telephone Encounter (Signed)
lmov to schedule appt  °

## 2017-12-04 NOTE — Telephone Encounter (Signed)
Pt calling asking for a call back, he states he has some questions about heart monitor we placed on him last week  Please call back

## 2017-12-08 NOTE — Telephone Encounter (Signed)
No ans no vm   °

## 2017-12-12 NOTE — Telephone Encounter (Signed)
lmov to schedule  °

## 2017-12-20 ENCOUNTER — Encounter: Payer: Self-pay | Admitting: Cardiovascular Disease

## 2017-12-20 NOTE — Telephone Encounter (Signed)
Sent letter

## 2017-12-23 DIAGNOSIS — I471 Supraventricular tachycardia: Secondary | ICD-10-CM | POA: Diagnosis not present

## 2017-12-25 ENCOUNTER — Other Ambulatory Visit: Payer: Self-pay | Admitting: *Deleted

## 2017-12-25 DIAGNOSIS — R002 Palpitations: Secondary | ICD-10-CM

## 2018-01-02 ENCOUNTER — Telehealth: Payer: Self-pay | Admitting: Cardiovascular Disease

## 2018-01-02 MED ORDER — DILTIAZEM HCL ER COATED BEADS 120 MG PO CP24: 120.0000 mg | ORAL_CAPSULE | Freq: Every day | ORAL | 6 refills | Status: DC

## 2018-01-02 NOTE — Telephone Encounter (Signed)
Results of monitor called to pt. Pt verbalized understanding of results. He states he does feel his heart beating fast at times and would like to try something to prevent this. He is agreeable to stop the amlodipine and start diltiazem 120 mg once a day. Rx sent to pharmacy. He was very Adult nurse.

## 2018-01-02 NOTE — Telephone Encounter (Signed)
Patient calling to go over results from holter monitor

## 2018-01-05 NOTE — Telephone Encounter (Signed)
I spoke with the patient. He wanted to make sure he was to continue with the HCTZ. I advised him that he should currently be taking diltiazem and HCTZ. He voices understanding.

## 2018-01-05 NOTE — Telephone Encounter (Signed)
Patient has questions regarding his medication change  Please call to discuss

## 2018-01-08 ENCOUNTER — Encounter: Payer: Self-pay | Admitting: Cardiovascular Disease

## 2018-02-06 ENCOUNTER — Encounter: Payer: Self-pay | Admitting: Family Medicine

## 2018-02-09 ENCOUNTER — Encounter: Payer: Self-pay | Admitting: Physician Assistant

## 2018-02-09 ENCOUNTER — Ambulatory Visit: Payer: BLUE CROSS/BLUE SHIELD | Admitting: Physician Assistant

## 2018-02-09 VITALS — BP 144/98 | HR 70 | Temp 98.6°F | Ht 66.0 in | Wt 212.7 lb

## 2018-02-09 DIAGNOSIS — M545 Low back pain, unspecified: Secondary | ICD-10-CM

## 2018-02-09 DIAGNOSIS — I10 Essential (primary) hypertension: Secondary | ICD-10-CM | POA: Diagnosis not present

## 2018-02-09 DIAGNOSIS — R0602 Shortness of breath: Secondary | ICD-10-CM

## 2018-02-09 MED ORDER — ALBUTEROL SULFATE HFA 108 (90 BASE) MCG/ACT IN AERS: 2.0000 | INHALATION_SPRAY | Freq: Four times a day (QID) | RESPIRATORY_TRACT | 2 refills | Status: AC | PRN

## 2018-02-09 MED ORDER — AMLODIPINE BESYLATE 5 MG PO TABS: 5.0000 mg | ORAL_TABLET | Freq: Every day | ORAL | 0 refills | Status: DC

## 2018-02-09 NOTE — Patient Instructions (Signed)

## 2018-02-09 NOTE — Progress Notes (Signed)
Subjective:    Patient ID: Clarence Ray, male    DOB: 10/13/1975, 42 y.o.   MRN: [id number]  Clarence Ray is a 42 y.o. male presenting on 02/09/2018 for Medication Problem (pt states that ever since he started on diltiazem, he has had increased SOB, dizziness, and headaches) and Back Pain (lower back, has become a daily problem )   HPI   Clarence Ray is a 42 y/o man with history of SVT, HTN, and tobacco abuse presenting today for possible side effects of medication.  Saw Dr. Mariah MillingGollan in April and was swtiched from amlodipine to diltiazem for rate control of SVT and also HTN. He reports headaches since then that have lessened in frequency. Patient has history of drinking 6-8 beer per day. He has recently cut back to 1-2 beers per every 1-3 days. He says he feels quite poorly after this.   Also reports SOB that can happen at any time, no matter the situation. He could be resting or exerting himself. Denies audible wheezing. No longer smokes, quit 8 months ago and has a ~ 22 pack year smoking history. Has a history of asthma.  Also reporting low back pain constantly. Has gained weight recently. No problems urinating. Wonders if his kidney function is OK and if this could indicate issues with his kidney. No history of falls, injuries, IVDU or cancer.     Social History   Tobacco Use  . Smoking status: Former Smoker    Last attempt to quit: 06/19/2017    Years since quitting: 0.6  . Smokeless tobacco: Never Used  Substance Use Topics  . Alcohol use: Yes    Comment: on occasion  . Drug use: No    Review of Systems Per HPI unless specifically indicated above     Objective:    BP (!) 144/98 (BP Location: Left Arm, Cuff Size: Large)   Pulse 70   Temp 98.6 F (37 C) (Oral)   Ht 5\' 6"  (1.676 m)   Wt 212 lb 11.2 oz (96.5 kg)   SpO2 95%   BMI 34.33 kg/m   Wt Readings from Last 3 Encounters:  02/09/18 212 lb 11.2 oz (96.5 kg)  11/30/17 212 lb 12 oz (96.5 kg)  10/04/17  213 lb 6 oz (96.8 kg)    Physical Exam  Constitutional: He is oriented to person, place, and time. He appears well-developed and well-nourished.  Cardiovascular: Normal rate and regular rhythm.  Pulmonary/Chest: Effort normal and breath sounds normal.  Abdominal: Soft. Bowel sounds are normal.  Musculoskeletal: Normal range of motion. He exhibits no edema, tenderness or deformity.       Lumbar back: He exhibits pain. He exhibits normal range of motion, no tenderness, no bony tenderness, no swelling, no edema, no deformity, no laceration, no spasm and normal pulse.  SLR negative bilaterally.  Neurological: He is alert and oriented to person, place, and time. He has normal strength. No sensory deficit.  Reflex Scores:      Patellar reflexes are 2+ on the right side and 2+ on the left side.      Achilles reflexes are 2+ on the right side and 2+ on the left side. Skin: Skin is warm and dry.  Psychiatric: He has a normal mood and affect. His behavior is normal.   Results for orders placed or performed in visit on 10/04/17  Basic metabolic panel  Result Value Ref Range   Glucose 108 (H) 65 - 99 mg/dL  BUN 14 6 - 24 mg/dL   Creatinine, Ser 9.56 0.76 - 1.27 mg/dL   GFR calc non Af Amer 93 >59 mL/min/1.73   GFR calc Af Amer 107 >59 mL/min/1.73   BUN/Creatinine Ratio 14 9 - 20   Sodium 142 134 - 144 mmol/L   Potassium 4.3 3.5 - 5.2 mmol/L   Chloride 99 96 - 106 mmol/L   CO2 24 20 - 29 mmol/L   Calcium 10.1 8.7 - 10.2 mg/dL      Assessment & Plan:   1. Essential hypertension  Will add amlodipine. Diltiazem may potentiate effects of amlodipine, counseled patient on monitoring for dizziness. Hopefully controlling BP will lessen headaches.  - amLODipine (NORVASC) 5 MG tablet; Take 1 tablet (5 mg total) by mouth daily.  Dispense: 90 tablet; Refill: 0  2. SOB (shortness of breath)  - albuterol (PROVENTIL HFA;VENTOLIN HFA) 108 (90 Base) MCG/ACT inhaler; Inhale 2 puffs into the lungs every  6 (six) hours as needed for wheezing or shortness of breath.  Dispense: 1 Inhaler; Refill: 2  3. Bilateral low back pain without sciatica, unspecified chronicity  Very likely MSK. Have reviewed patient's kidney function based on last CMET and it is completely normal. Counseled on importance of controlling BP to maintain good kidney function.    Follow up plan: Return in about 2 weeks (around 02/23/2018) for HTN, SOB, back pain .  Osvaldo Angst, PA-C [facility] 02/09/2018, 12:28 PM

## 2018-02-26 ENCOUNTER — Ambulatory Visit: Payer: BLUE CROSS/BLUE SHIELD | Admitting: Family Medicine

## 2018-03-08 ENCOUNTER — Encounter: Payer: Self-pay | Admitting: Family Medicine

## 2018-03-14 NOTE — Telephone Encounter (Signed)
Please let patient know that I will not charge a fee.  Thanks

## 2018-04-06 ENCOUNTER — Other Ambulatory Visit: Payer: Self-pay | Admitting: Family Medicine

## 2018-04-06 NOTE — Telephone Encounter (Signed)
LOV 02/09/18 for refill for hydrochlorothiazide

## 2018-04-06 NOTE — Telephone Encounter (Signed)
hydrochlorothiazide refill Last Refill:09/19/17 # 90  With 1 refill Last OV: 06/14/ PCP: Olevia PerchesMegan Johnson Pharmacy:[facility]

## 2018-07-02 ENCOUNTER — Other Ambulatory Visit: Payer: Self-pay | Admitting: Family Medicine

## 2018-07-03 NOTE — Telephone Encounter (Signed)
Courtesy refill  

## 2018-08-02 ENCOUNTER — Other Ambulatory Visit: Payer: Self-pay | Admitting: Cardiovascular Disease

## 2018-08-02 ENCOUNTER — Other Ambulatory Visit: Payer: Self-pay | Admitting: Physician Assistant

## 2018-08-02 DIAGNOSIS — I10 Essential (primary) hypertension: Secondary | ICD-10-CM

## 2018-08-03 NOTE — Telephone Encounter (Signed)
Needs follow-up

## 2018-08-03 NOTE — Telephone Encounter (Signed)
Needs follow up for more refills

## 2018-08-06 ENCOUNTER — Encounter: Payer: Self-pay | Admitting: Family Medicine

## 2018-08-06 ENCOUNTER — Other Ambulatory Visit: Payer: Self-pay | Admitting: Cardiovascular Disease

## 2018-08-06 NOTE — Telephone Encounter (Signed)
*  STAT* If patient is at the pharmacy, call can be transferred to refill team.   1. Which medications need to be refilled? (please list name of each medication and dose if known) diltiazem  2. Which pharmacy/location (including street and city if local pharmacy) is medication to be sent to? [facility] [place]  3. Do they need a 30 day or 90 day supply? 30  States that [place] seems to help him better. Please call to discuss.

## 2018-08-07 ENCOUNTER — Other Ambulatory Visit: Payer: Self-pay | Admitting: *Deleted

## 2018-08-07 ENCOUNTER — Other Ambulatory Visit: Payer: Self-pay | Admitting: Family Medicine

## 2018-08-07 MED ORDER — DILTIAZEM HCL ER COATED BEADS 120 MG PO CP24: 120.0000 mg | ORAL_CAPSULE | Freq: Every day | ORAL | 0 refills | Status: DC

## 2018-08-07 NOTE — Telephone Encounter (Signed)
Patient is out of medication  States that refill has been denied  Spoke with Jearld AdjutantMarina, refill request has been sent and appointment scheduled for 12/23 Nothing further needed at this time

## 2018-08-07 NOTE — Telephone Encounter (Signed)
Requested Prescriptions   Signed Prescriptions Disp Refills  . diltiazem (CARDIZEM CD) 120 MG 24 hr capsule 30 capsule 0    Sig: Take 1 capsule (120 mg total) by mouth daily. Pt needs to contact office to schedule future appointment and refills, Thank you. [phone]    Authorizing Provider: Antonieta IbaGOLLAN, TIMOTHY J    Ordering User: Iverson AlaminLOPEZ, MARINA C     Pt made aware that he is OD for 2-3 month f/u. Rx sent in until pt is able to be seen next appointment. Requested Diltiazem to be filled Brand name per pt request.

## 2018-08-07 NOTE — Telephone Encounter (Signed)
Requested Prescriptions   Signed Prescriptions Disp Refills  . diltiazem (CARDIZEM CD) 120 MG 24 hr capsule 30 capsule 0    Sig: Take 1 capsule (120 mg total) by mouth daily. Pt needs to contact office to schedule future appointment and refills, Thank you. [phone]    Authorizing Provider: Antonieta IbaGOLLAN, TIMOTHY J    Ordering User: Kendrick FriesLOPEZ, Raahil Ong C

## 2018-08-09 NOTE — Telephone Encounter (Signed)
Approved 30 day supply only. Appointment on chart for 08/20/18. Patient must keep this appointment for further refills.

## 2018-08-19 NOTE — Progress Notes (Signed)
Cardiology Office Note  Date:  08/20/2018   ID:  Clarence Ray, DOB 03/22/1976, MRN [id number]  PCP:  Dorcas CarrowJohnson, Megan P, DO   Chief Complaint  Patient presents with  . other    3 mo follow up. Medications reviewed verbally.     HPI:  Clarence Ray is a 42 year old gentleman with past medical history of Hx of ETOH abuse with associated tachycardia/palpitations  smoking ( quit 6 months ago) in the past year Hypertension Palpitations F/u of  his palpitations/paroxysmal tachycardia  Reports that he has cut back on his alcohol Does have occasional social events where he may drink more Denies having significant tachycardia or palpitations on his current medication regimen Concerned about blood pressure running high Also very constipated  Doing well on cartia XT Did not fell well  On diltiazem generic  Changed diet, down to 200 pound Now back up  EKG personally reviewed by myself on todays visit Shows normal sinus rhythm with rate 83 bpm no significant ST or T wave changes  Previous records reviewed, tachycardia concerning for atrial tachycardia or SVT Previously felt to be triggered more by heavy alcohol but not always On prior office visit reported drinking beer and hard alcohol On these occasions will develop at least 30 minutes of profound tachycardia with associated shortness of breath  Event monitor placed  2 Supraventricular Tachycardia runs occurred, the run with the fastest interval lasting 4 beats with a max rate of 174 bpm, the longest lasting 10 beats with an avg rate of 112 bpm.   Total cholesterol 194 LDL 127  Renal artery ultrasound with no stenosis  Long hx of palpitations, since childhood, Occasional skipped beats  Since September has started to have episodes of tachycardia  commonly associated when he drinks heavily  episodes are severe, described as a pounding, Rapid Last on Sunday (was drinking heavy on Sat and Sunday) rate 136, SOB >30 min, layed  in bed  In September reported having severe episode of tachycardia  in the setting of ETOH Significant shortness of breath Arm tingling, emts came,  Tachycardia resolved by the time EMTs arrived   PMH:   has a past medical history of Hyperlipidemia and Hypertension.  ETOH abuse, former smoker  PSH:   No past surgical history on file.  Current Outpatient Medications  Medication Sig Dispense Refill  . hydrochlorothiazide (HYDRODIURIL) 25 MG tablet TAKE 1 TABLET BY MOUTH DAILY 30 tablet 0  . albuterol (PROVENTIL HFA;VENTOLIN HFA) 108 (90 Base) MCG/ACT inhaler Inhale 2 puffs into the lungs every 6 (six) hours as needed for wheezing or shortness of breath. (Patient not taking: Reported on 08/20/2018) 1 Inhaler 2  . diltiazem (CARDIZEM CD) 180 MG 24 hr capsule Take 1 capsule (180 mg total) by mouth daily. 30 capsule 11   No current facility-administered medications for this visit.      Allergies:   Atorvastatin and Losartan   Social History:  The patient  reports that he quit smoking about 14 months ago. He has never used smokeless tobacco. He reports current alcohol use. He reports that he does not use drugs.   Family History:   family history includes Anxiety disorder in his sister; Fibromyalgia in his sister and sister; Heart disease in his mother; Hypertension in his mother; Stroke in his paternal grandmother.    Review of Systems: Review of Systems  Constitutional: Negative.   Respiratory: Negative.   Cardiovascular: Negative.   Gastrointestinal: Negative.   Musculoskeletal: Negative.   Neurological:  Negative.   Psychiatric/Behavioral: Negative.   All other systems reviewed and are negative.    PHYSICAL EXAM: VS:  BP (!) 148/102 (BP Location: Left Arm, Patient Position: Sitting, Cuff Size: Normal)   Pulse 83   Ht 5\' 6"  (1.676 m)   Wt 214 lb (97.1 kg)   BMI 34.54 kg/m  , BMI Body mass index is 34.54 kg/m. Constitutional:  oriented to person, place, and time. No  distress.  HENT:  Head: Grossly normal Eyes:  no discharge. No scleral icterus.  Neck: No JVD, no carotid bruits  Cardiovascular: Regular rate and rhythm, no murmurs appreciated Pulmonary/Chest: Clear to auscultation bilaterally, no wheezes or rails Abdominal: Soft.  no distension.  no tenderness.  Musculoskeletal: Normal range of motion Neurological:  normal muscle tone. Coordination normal. No atrophy Skin: Skin warm and dry Psychiatric: normal affect, pleasant  Recent Labs: 09/19/2017: ALT 18; Hemoglobin 16.0; Platelets 200; TSH 0.806 10/04/2017: BUN 14; Creatinine, Ser 1.01; Potassium 4.3; Sodium 142    Lipid Panel Lab Results  Component Value Date   CHOL 194 09/19/2017   HDL 31 (L) 09/19/2017   LDLCALC 127 (H) 09/19/2017   TRIG 180 (H) 09/19/2017      Wt Readings from Last 3 Encounters:  08/20/18 214 lb (97.1 kg)  02/09/18 212 lb 11.2 oz (96.5 kg)  11/30/17 212 lb 12 oz (96.5 kg)     ASSESSMENT AND PLAN:  Paroxysmal tachycardia (HCC) Concerning for atrial tachycardia or SVT Denies having significant symptoms at this time Would like to get blood pressure under control Recommended he increase his Cartia up to 180 mg daily and hold the amlodipine  Palpitations - Plan: EKG 12-Lead Recommended moderation of his alcohol and stay on diltiazem as above  Essential hypertension Recommend weight loss, low carbohydrates We will hold amlodipine increase Cartia up to 180 daily If blood pressure continues to run high could increase Cartia to 240 daily He will continue to take amlodipine 5 mg as needed for diastolic over 90  Former smoker Recommended smoking cessation  Mixed hyperlipidemia  Weight continues to run high Recommended low carbohydrates  Alcohol abuse Moderate alcohol Association with worsening tachycardia  Constipation We will try mixture of MiraLAX with Citrucel low-dose every day Hydrate more  Disposition:   F/U  12 months   Total encounter time  more than 25 minutes  Greater than 50% was spent in counseling and coordination of care with the patient    Orders Placed This Encounter  Procedures  . EKG 12-Lead     Signed, Dossie Arbourim Mahima Hottle, M.D., Ph.D. 08/20/2018  [facility], [place] [phone]

## 2018-08-20 ENCOUNTER — Encounter: Payer: Self-pay | Admitting: Cardiovascular Disease

## 2018-08-20 ENCOUNTER — Ambulatory Visit: Payer: BLUE CROSS/BLUE SHIELD | Admitting: Cardiovascular Disease

## 2018-08-20 VITALS — BP 148/102 | HR 83 | Ht 66.0 in | Wt 214.0 lb

## 2018-08-20 DIAGNOSIS — I479 Paroxysmal tachycardia, unspecified: Secondary | ICD-10-CM

## 2018-08-20 DIAGNOSIS — I10 Essential (primary) hypertension: Secondary | ICD-10-CM

## 2018-08-20 DIAGNOSIS — F1010 Alcohol abuse, uncomplicated: Secondary | ICD-10-CM

## 2018-08-20 DIAGNOSIS — E782 Mixed hyperlipidemia: Secondary | ICD-10-CM | POA: Diagnosis not present

## 2018-08-20 DIAGNOSIS — Z23 Encounter for immunization: Secondary | ICD-10-CM

## 2018-08-20 DIAGNOSIS — Z87891 Personal history of nicotine dependence: Secondary | ICD-10-CM

## 2018-08-20 MED ORDER — DILTIAZEM HCL ER COATED BEADS 180 MG PO CP24: 180.0000 mg | ORAL_CAPSULE | Freq: Every day | ORAL | 11 refills | Status: DC

## 2018-08-20 NOTE — Patient Instructions (Addendum)
Medication Instructions:   Please hold the amlodipine Increase the cartia XT up to 180 daily  Generic miralex (2) and citracel (1):    If you need a refill on your cardiac medications before your next appointment, please call your pharmacy.    Lab work: No new labs needed   If you have labs (blood work) drawn today and your tests are completely normal, you will receive your results only by: Marland Kitchen. MyChart Message (if you have MyChart) OR . A paper copy in the mail If you have any lab test that is abnormal or we need to change your treatment, we will call you to review the results.   Testing/Procedures: No new testing needed   Follow-Up: At [facility], you and your health needs are our priority.  As part of our continuing mission to provide you with exceptional heart care, we have created designated Provider Care Teams.  These Care Teams include your primary Cardiologist (physician) and Advanced Practice Providers (APPs -  Physician Assistants and Nurse Practitioners) who all work together to provide you with the care you need, when you need it.  . You will need a follow up appointment in 12 months .   Please call our office 2 months in advance to schedule this appointment.    . Providers on your designated Care Team:   . Nicolasa Duckinghristopher Berge, NP . Eula Listenyan Dunn, PA-C . Marisue IvanJacquelyn Visser, PA-C  Any Other Special Instructions Will Be Listed Below (If Applicable).  For educational health videos Log in to : [email or web address] Or : [email or web address], password : [email or web address]

## 2018-09-06 ENCOUNTER — Other Ambulatory Visit: Payer: Self-pay | Admitting: *Deleted

## 2018-09-06 MED ORDER — DILTIAZEM HCL ER COATED BEADS 240 MG PO CP24: 240.0000 mg | ORAL_CAPSULE | Freq: Every day | ORAL | 3 refills | Status: DC

## 2018-09-18 ENCOUNTER — Other Ambulatory Visit: Payer: Self-pay | Admitting: Family Medicine

## 2018-09-18 NOTE — Telephone Encounter (Signed)
Requested medication (s) are due for refill today: yes  Requested medication (s) are on the active medication list: yes  Last refill:  08/09/18 for 30 tabls  Future visit scheduled: yes  Notes to clinic:  Thiazide failed.  Pend for 51 tabs, enough until appointment.  Requested Prescriptions  Pending Prescriptions Disp Refills   hydrochlorothiazide (HYDRODIURIL) 25 MG tablet [Pharmacy Med Name: HYDROCHLOROTHIAZIDE 25MG  TABLETS] 51 tablet 0    Sig: TAKE 1 TABLET BY MOUTH DAILY     Cardiovascular: Diuretics - Thiazide Failed - 09/18/2018 11:37 AM      Failed - Last BP in normal range    BP Readings from Last 1 Encounters:  08/20/18 (!) 148/102         Failed - Valid encounter within last 6 months    Recent Outpatient Visits          7 months ago Essential hypertension   [facility] Osvaldo Angst M, PA-C   11 months ago Essential hypertension   [facility] [place], [place], DO   12 months ago Essential hypertension   [facility] [place], [place], DO      Future Appointments            In 1 month Johnson, Megan P, DO [facility]           Passed - Ca in normal range and within 360 days    Calcium  Date Value Ref Range Status  10/04/2017 10.1 8.7 - 10.2 mg/dL Final         Passed - Cr in normal range and within 360 days    Creatinine, Ser  Date Value Ref Range Status  10/04/2017 1.01 0.76 - 1.27 mg/dL Final         Passed - K in normal range and within 360 days    Potassium  Date Value Ref Range Status  10/04/2017 4.3 3.5 - 5.2 mmol/L Final         Passed - Na in normal range and within 360 days    Sodium  Date Value Ref Range Status  10/04/2017 142 134 - 144 mmol/L Final

## 2018-11-09 ENCOUNTER — Ambulatory Visit: Payer: BLUE CROSS/BLUE SHIELD | Admitting: Family Medicine

## 2018-12-04 MED ORDER — DILTIAZEM HCL ER COATED BEADS 120 MG PO CP24: 240.0000 mg | ORAL_CAPSULE | Freq: Every day | ORAL | 2 refills | Status: DC

## 2018-12-04 NOTE — Telephone Encounter (Signed)
Called patient's pharmacy [facility]. Said patient wants the Brand drug Cartia which they are out of the 240 mg capsule. They do have Cartia 120 mg. Resent prescription in for patient to take 2 capsules (240 mg) daily. Message sent to patient via MyChart this was done.

## 2018-12-13 ENCOUNTER — Other Ambulatory Visit: Payer: Self-pay | Admitting: Family Medicine

## 2018-12-13 ENCOUNTER — Telehealth: Payer: Self-pay | Admitting: Family Medicine

## 2018-12-13 NOTE — Telephone Encounter (Signed)
He should have enough of his HCTZ to last to next week. He has not been seen in almost a year, so will need to keep his appointment to get more refills.

## 2018-12-13 NOTE — Telephone Encounter (Signed)
Scheduled phone visit 4-20 @ 10 with Dr Laural Benes Pt has bp cuff

## 2018-12-13 NOTE — Telephone Encounter (Signed)
Patient is calling to schedule BP follow up appointment he has Iphone but no service so he is requesting an appointment over the phone (land line).  He has a bp cuff to take his vitals.  He is also needing a refill request on hydrochlorothiazide sent to [facility]  Please advise.  Thank you

## 2018-12-14 ENCOUNTER — Ambulatory Visit: Payer: BLUE CROSS/BLUE SHIELD | Admitting: Family Medicine

## 2018-12-17 ENCOUNTER — Other Ambulatory Visit: Payer: Self-pay

## 2018-12-17 ENCOUNTER — Ambulatory Visit (INDEPENDENT_AMBULATORY_CARE_PROVIDER_SITE_OTHER): Payer: BLUE CROSS/BLUE SHIELD | Admitting: Family Medicine

## 2018-12-17 ENCOUNTER — Encounter: Payer: Self-pay | Admitting: Family Medicine

## 2018-12-17 VITALS — BP 144/94 | HR 77 | Wt 213.0 lb

## 2018-12-17 DIAGNOSIS — I10 Essential (primary) hypertension: Secondary | ICD-10-CM | POA: Diagnosis not present

## 2018-12-17 DIAGNOSIS — Z114 Encounter for screening for human immunodeficiency virus [HIV]: Secondary | ICD-10-CM

## 2018-12-17 DIAGNOSIS — E669 Obesity, unspecified: Secondary | ICD-10-CM

## 2018-12-17 DIAGNOSIS — E782 Mixed hyperlipidemia: Secondary | ICD-10-CM

## 2018-12-17 MED ORDER — HYDROCHLOROTHIAZIDE 25 MG PO TABS: 25.0000 mg | ORAL_TABLET | Freq: Every day | ORAL | 1 refills | Status: DC

## 2018-12-17 MED ORDER — DILTIAZEM HCL ER COATED BEADS 360 MG PO CP24: 360.0000 mg | ORAL_CAPSULE | Freq: Every day | ORAL | 1 refills | Status: DC

## 2018-12-17 NOTE — Assessment & Plan Note (Signed)
Will check labs and treat as needed. Call with any concerns.  

## 2018-12-17 NOTE — Assessment & Plan Note (Signed)
Will check labs. Await results. Encouraged patient to work on diet and exercise with goal of losing 1-2lbs a week.

## 2018-12-17 NOTE — Assessment & Plan Note (Signed)
Slightly better on recheck, but remains high. Will increase his diltiazem from 240 to 360, will monitor for lows- if going low will go back to 240 and add 2.5mg  of amlodipine as he cannot tolerate losartan. Recheck 1 week. Call with any concerns. Will come in for labs. Refills given.

## 2018-12-17 NOTE — Progress Notes (Signed)
BP (!) 144/94   Pulse 77   Wt 213 lb (96.6 kg)   BMI 34.38 kg/m    Subjective:    Patient ID: Clarence Ray, male    DOB: 1975-09-02, 43 y.o.   MRN: [id number]  HPI: Clarence Ray is a 43 y.o. male  Chief Complaint  Patient presents with  . Hyperlipidemia  . Hypertension   HYPERTENSION / HYPERLIPIDEMIA Satisfied with current treatment? no Duration of hypertension: chronic BP monitoring frequency: rarely BP medication side effects: no Past BP meds: diltiazem, HCTZ Duration of hyperlipidemia: chronic Cholesterol medication side effects: no Cholesterol supplements: none Past cholesterol medications: none Medication compliance: good compliance Aspirin: no Recent stressors: no Recurrent headaches: no Visual changes: no Palpitations: no Dyspnea: no Chest pain: no Lower extremity edema: no Dizzy/lightheaded: no   Relevant past medical, surgical, family and social history reviewed and updated as indicated. Interim medical history since our last visit reviewed. Allergies and medications reviewed and updated.  Review of Systems  Constitutional: Negative.   Respiratory: Negative.   Cardiovascular: Negative.   Neurological: Negative.   Psychiatric/Behavioral: Negative.     Per HPI unless specifically indicated above     Objective:    BP (!) 144/94   Pulse 77   Wt 213 lb (96.6 kg)   BMI 34.38 kg/m   Wt Readings from Last 3 Encounters:  12/17/18 213 lb (96.6 kg)  08/20/18 214 lb (97.1 kg)  02/09/18 212 lb 11.2 oz (96.5 kg)    Physical Exam Vitals signs and nursing note reviewed.  Pulmonary:     Effort: Pulmonary effort is normal. No respiratory distress.     Comments: Speaking in full sentences Neurological:     Mental Status: He is alert.  Psychiatric:        Mood and Affect: Mood normal.        Behavior: Behavior normal.        Thought Content: Thought content normal.        Judgment: Judgment normal.     Results for orders placed or  performed in visit on 10/04/17  Basic metabolic panel  Result Value Ref Range   Glucose 108 (H) 65 - 99 mg/dL   BUN 14 6 - 24 mg/dL   Creatinine, Ser 2.83 0.76 - 1.27 mg/dL   GFR calc non Af Amer 93 >59 mL/min/1.73   GFR calc Af Amer 107 >59 mL/min/1.73   BUN/Creatinine Ratio 14 9 - 20   Sodium 142 134 - 144 mmol/L   Potassium 4.3 3.5 - 5.2 mmol/L   Chloride 99 96 - 106 mmol/L   CO2 24 20 - 29 mmol/L   Calcium 10.1 8.7 - 10.2 mg/dL      Assessment & Plan:   Problem List Items Addressed This Visit      Cardiovascular and Mediastinum   Essential hypertension - Primary    Slightly better on recheck, but remains high. Will increase his diltiazem from 240 to 360, will monitor for lows- if going low will go back to 240 and add 2.5mg  of amlodipine as he cannot tolerate losartan. Recheck 1 week. Call with any concerns. Will come in for labs. Refills given.       Relevant Medications   diltiazem (CARDIZEM CD) 360 MG 24 hr capsule   hydrochlorothiazide (HYDRODIURIL) 25 MG tablet   Other Relevant Orders   Comprehensive metabolic panel   CBC with Differential/Platelet   Microalbumin, Urine Waived   TSH   UA/M w/rflx Culture,  Routine     Other   Mixed hyperlipidemia    Will check labs and treat as needed. Call with any concerns.       Relevant Medications   diltiazem (CARDIZEM CD) 360 MG 24 hr capsule   hydrochlorothiazide (HYDRODIURIL) 25 MG tablet   Other Relevant Orders   Comprehensive metabolic panel   Lipid Panel w/o Chol/HDL Ratio   Obesity (BMI 30-39.9)    Will check labs. Await results. Encouraged patient to work on diet and exercise with goal of losing 1-2lbs a week.        Other Visit Diagnoses    Obesity (BMI 30.0-34.9)       Relevant Orders   Bayer DCA Hb A1c Waived   Screening for HIV without presence of risk factors       Labs to be drawn. Await results.    Relevant Orders   HIV Antibody (routine testing w rflx)       Follow up plan: Return in about 1  week (around 12/24/2018) for follow up BP.   Marland Kitchen. This visit was completed via telephone due to the restrictions of the COVID-19 pandemic. All issues as above were discussed and addressed but no physical exam was performed. If it was felt that the patient should be evaluated in the office, they were directed there. The patient verbally consented to this visit. Patient was unable to complete an audio/visual visit due to Lack of equipment. Due to the catastrophic nature of the COVID-19 pandemic, this visit was done through audio contact only. . Location of the patient: home . Location of the provider: home . Those involved with this call:  . Provider: Olevia PerchesMegan Jleigh Striplin, DO . CMA: Tiffany Reel, CMA . Front Desk/Registration: Adela Portshristan Williamson  . Time spent on call: 25 minutes on the phone discussing health concerns. 40 minutes total spent in review of patient's record and preparation of their chart.

## 2019-01-08 ENCOUNTER — Encounter: Payer: Self-pay | Admitting: Family Medicine

## 2019-01-14 ENCOUNTER — Telehealth: Payer: Self-pay | Admitting: Family Medicine

## 2019-01-14 NOTE — Telephone Encounter (Signed)
Left VM to set up virtual, voicemail full

## 2019-01-15 ENCOUNTER — Telehealth: Payer: Self-pay | Admitting: Family Medicine

## 2019-01-15 ENCOUNTER — Other Ambulatory Visit: Payer: Self-pay

## 2019-01-15 ENCOUNTER — Ambulatory Visit (INDEPENDENT_AMBULATORY_CARE_PROVIDER_SITE_OTHER): Payer: BLUE CROSS/BLUE SHIELD | Admitting: Family Medicine

## 2019-01-15 ENCOUNTER — Encounter: Payer: Self-pay | Admitting: Family Medicine

## 2019-01-15 VITALS — BP 144/93 | HR 74 | Ht 66.0 in | Wt 213.0 lb

## 2019-01-15 DIAGNOSIS — I10 Essential (primary) hypertension: Secondary | ICD-10-CM

## 2019-01-15 NOTE — Telephone Encounter (Signed)
Please call this afternoon to set up blood work and vitals ASAP and October physical

## 2019-01-15 NOTE — Progress Notes (Signed)
BP (!) 144/93   Pulse 74   Ht 5\' 6"  (1.676 m)   Wt 213 lb (96.6 kg)   BMI 34.38 kg/m    Subjective:    Patient ID: Clarence Ray, male    DOB: 1975-12-08, 43 y.o.   MRN: [id number]  HPI: Clarence Ray is a 43 y.o. male  Chief Complaint  Patient presents with  . Hypertension    f/u   HYPERTENSION- states that he has been feeling well on the higher dose of the medicine. He notes that his legs are swelling more.  Hypertension status: stable  Satisfied with current treatment? yes Duration of hypertension: chronic BP monitoring frequency:  a few times a month BP range: 130s/80s BP medication side effects:  no Medication compliance: good compliance Previous BP meds: diltiazem, HCTZ Aspirin: no Recurrent headaches: no Visual changes: no Palpitations: no Dyspnea: no Chest pain: no Lower extremity edema: yes Dizzy/lightheaded: no  Relevant past medical, surgical, family and social history reviewed and updated as indicated. Interim medical history since our last visit reviewed. Allergies and medications reviewed and updated.  Review of Systems  Constitutional: Negative.   Respiratory: Negative.   Cardiovascular: Negative.   Neurological: Negative.   Psychiatric/Behavioral: Negative.     Per HPI unless specifically indicated above     Objective:    BP (!) 144/93   Pulse 74   Ht 5\' 6"  (1.676 m)   Wt 213 lb (96.6 kg)   BMI 34.38 kg/m   Wt Readings from Last 3 Encounters:  01/15/19 213 lb (96.6 kg)  12/17/18 213 lb (96.6 kg)  08/20/18 214 lb (97.1 kg)    Physical Exam Vitals signs and nursing note reviewed.  Pulmonary:     Effort: Pulmonary effort is normal. No respiratory distress.     Comments: Speaking in full sentences Neurological:     Mental Status: He is alert.  Psychiatric:        Mood and Affect: Mood normal.        Behavior: Behavior normal.        Thought Content: Thought content normal.        Judgment: Judgment normal.     Results  for orders placed or performed in visit on 10/04/17  Basic metabolic panel  Result Value Ref Range   Glucose 108 (H) 65 - 99 mg/dL   BUN 14 6 - 24 mg/dL   Creatinine, Ser 1.66 0.76 - 1.27 mg/dL   GFR calc non Af Amer 93 >59 mL/min/1.73   GFR calc Af Amer 107 >59 mL/min/1.73   BUN/Creatinine Ratio 14 9 - 20   Sodium 142 134 - 144 mmol/L   Potassium 4.3 3.5 - 5.2 mmol/L   Chloride 99 96 - 106 mmol/L   CO2 24 20 - 29 mmol/L   Calcium 10.1 8.7 - 10.2 mg/dL      Assessment & Plan:   Problem List Items Addressed This Visit      Cardiovascular and Mediastinum   Essential hypertension - Primary    Under good control at home. Continue current regimen. Continue to monitor. Call with any concerns. Needs to come in for blood work.           Follow up plan: Return ASAP labs, vitals, October- physical.   . This visit was completed via telephone due to the restrictions of the COVID-19 pandemic. All issues as above were discussed and addressed but no physical exam was performed. If it was felt that  the patient should be evaluated in the office, they were directed there. The patient verbally consented to this visit. Patient was unable to complete an audio/visual visit due to Lack of equipment. Due to the catastrophic nature of the COVID-19 pandemic, this visit was done through audio contact only. . Location of the patient: home . Location of the provider: work . Those involved with this call:  . Provider: Olevia PerchesMegan , DO . CMA: Elton SinAnita Quito, CMA . Front Desk/Registration: Adela Portshristan Williamson  . Time spent on call: 23 minutes on the phone discussing health concerns. 25 minutes total spent in review of patient's record and preparation of their chart.

## 2019-01-15 NOTE — Assessment & Plan Note (Signed)
Under good control at home. Continue current regimen. Continue to monitor. Call with any concerns. Needs to come in for blood work.

## 2019-01-15 NOTE — Telephone Encounter (Signed)
Spoke with patient. He states he needs to discuss with his wife when she gets off from work. He will call the office back to schedule labs and vitals and also needs to schedule CPE for October

## 2019-01-17 ENCOUNTER — Encounter: Payer: Self-pay | Admitting: Family Medicine

## 2019-01-17 ENCOUNTER — Telehealth: Payer: Self-pay | Admitting: Family Medicine

## 2019-01-17 MED ORDER — HYDROCHLOROTHIAZIDE 25 MG PO TABS: 25.0000 mg | ORAL_TABLET | Freq: Every day | ORAL | 1 refills | Status: DC

## 2019-01-17 MED ORDER — DILTIAZEM HCL ER COATED BEADS 360 MG PO CP24: 360.0000 mg | ORAL_CAPSULE | Freq: Every day | ORAL | 1 refills | Status: DC

## 2019-01-17 NOTE — Telephone Encounter (Signed)
Dr. Laural Benes it looks like these scripts were written but printed back in April, is there anyway that you can resend them and put Brand name only on the one medication?

## 2019-01-17 NOTE — Telephone Encounter (Signed)
Called pt to schedule lab work, no answer, left voicemail. °

## 2019-01-17 NOTE — Telephone Encounter (Signed)
Prescriptions called in, verbal from Dr.Johnson that we can call in Cardia XT 180mg  2 tablets daily since it does not come in 360 mg tablets, patient requests brand name only.

## 2019-01-25 ENCOUNTER — Other Ambulatory Visit: Payer: BLUE CROSS/BLUE SHIELD

## 2019-01-25 ENCOUNTER — Other Ambulatory Visit: Payer: Self-pay

## 2019-01-25 DIAGNOSIS — E669 Obesity, unspecified: Secondary | ICD-10-CM | POA: Diagnosis not present

## 2019-01-25 DIAGNOSIS — E782 Mixed hyperlipidemia: Secondary | ICD-10-CM | POA: Diagnosis not present

## 2019-01-25 DIAGNOSIS — I10 Essential (primary) hypertension: Secondary | ICD-10-CM

## 2019-01-25 DIAGNOSIS — Z114 Encounter for screening for human immunodeficiency virus [HIV]: Secondary | ICD-10-CM

## 2019-01-25 LAB — UA/M W/RFLX CULTURE, ROUTINE
Bilirubin, UA: NEGATIVE
Glucose, UA: NEGATIVE
Ketones, UA: NEGATIVE
Leukocytes,UA: NEGATIVE
Nitrite, UA: NEGATIVE
Protein,UA: NEGATIVE
RBC, UA: NEGATIVE
Specific Gravity, UA: 1.02 (ref 1.005–1.030)
Urobilinogen, Ur: 0.2 mg/dL (ref 0.2–1.0)
pH, UA: 7.5 (ref 5.0–7.5)

## 2019-01-25 LAB — MICROALBUMIN, URINE WAIVED
Creatinine, Urine Waived: 50 mg/dL (ref 10–300)
Microalb, Ur Waived: 80 mg/L — ABNORMAL HIGH (ref 0–19)

## 2019-01-26 LAB — COMPREHENSIVE METABOLIC PANEL
ALT: 20 IU/L (ref 0–44)
AST: 22 IU/L (ref 0–40)
Albumin/Globulin Ratio: 2.1 (ref 1.2–2.2)
Albumin: 5.3 g/dL — ABNORMAL HIGH (ref 4.0–5.0)
Alkaline Phosphatase: 91 IU/L (ref 39–117)
BUN/Creatinine Ratio: 11 (ref 9–20)
BUN: 13 mg/dL (ref 6–24)
Bilirubin Total: 0.4 mg/dL (ref 0.0–1.2)
CO2: 24 mmol/L (ref 20–29)
Calcium: 10.1 mg/dL (ref 8.7–10.2)
Chloride: 101 mmol/L (ref 96–106)
Creatinine, Ser: 1.14 mg/dL (ref 0.76–1.27)
GFR calc Af Amer: 92 mL/min/{1.73_m2} (ref >59)
GFR calc non Af Amer: 79 mL/min/{1.73_m2} (ref >59)
Globulin, Total: 2.5 g/dL (ref 1.5–4.5)
Glucose: 92 mg/dL (ref 65–99)
Potassium: 3.9 mmol/L (ref 3.5–5.2)
Sodium: 143 mmol/L (ref 134–144)
Total Protein: 7.8 g/dL (ref 6.0–8.5)

## 2019-01-26 LAB — CBC WITH DIFFERENTIAL/PLATELET
Basophils Absolute: 0 10*3/uL (ref 0.0–0.2)
Basos: 1 %
EOS (ABSOLUTE): 0.2 10*3/uL (ref 0.0–0.4)
Eos: 4 %
Hematocrit: 47.8 % (ref 37.5–51.0)
Hemoglobin: 16.3 g/dL (ref 13.0–17.7)
Immature Grans (Abs): 0 10*3/uL (ref 0.0–0.1)
Immature Granulocytes: 0 %
Lymphocytes Absolute: 2.5 10*3/uL (ref 0.7–3.1)
Lymphs: 41 %
MCH: 29.6 pg (ref 26.6–33.0)
MCHC: 34.1 g/dL (ref 31.5–35.7)
MCV: 87 fL (ref 79–97)
Monocytes Absolute: 0.7 10*3/uL (ref 0.1–0.9)
Monocytes: 12 %
Neutrophils Absolute: 2.5 10*3/uL (ref 1.4–7.0)
Neutrophils: 42 %
Platelets: 243 10*3/uL (ref 150–450)
RBC: 5.51 x10E6/uL (ref 4.14–5.80)
RDW: 13.9 % (ref 11.6–15.4)
WBC: 6 10*3/uL (ref 3.4–10.8)

## 2019-01-26 LAB — HIV ANTIBODY (ROUTINE TESTING W REFLEX): HIV Screen 4th Generation wRfx: NONREACTIVE

## 2019-01-26 LAB — LIPID PANEL W/O CHOL/HDL RATIO
Cholesterol, Total: 226 mg/dL — ABNORMAL HIGH (ref 100–199)
HDL: 36 mg/dL — ABNORMAL LOW (ref >39)
LDL Calculated: 171 mg/dL — ABNORMAL HIGH (ref 0–99)
Triglycerides: 94 mg/dL (ref 0–149)
VLDL Cholesterol Cal: 19 mg/dL (ref 5–40)

## 2019-01-26 LAB — TSH: TSH: 1.07 u[IU]/mL (ref 0.450–4.500)

## 2019-01-31 LAB — SPECIMEN STATUS REPORT

## 2019-01-31 LAB — HGB A1C W/O EAG: Hgb A1c MFr Bld: 6 % — ABNORMAL HIGH (ref 4.8–5.6)

## 2019-03-18 ENCOUNTER — Encounter: Payer: Self-pay | Admitting: Family Medicine

## 2019-03-20 NOTE — Telephone Encounter (Signed)
Pt scheduled  

## 2019-03-21 ENCOUNTER — Ambulatory Visit: Payer: BLUE CROSS/BLUE SHIELD | Admitting: Family Medicine

## 2019-03-21 ENCOUNTER — Encounter: Payer: Self-pay | Admitting: Family Medicine

## 2019-03-21 ENCOUNTER — Other Ambulatory Visit: Payer: Self-pay

## 2019-03-21 VITALS — BP 148/102 | HR 76 | Temp 98.4°F | Wt 213.0 lb

## 2019-03-21 DIAGNOSIS — R002 Palpitations: Secondary | ICD-10-CM

## 2019-03-21 DIAGNOSIS — I10 Essential (primary) hypertension: Secondary | ICD-10-CM | POA: Diagnosis not present

## 2019-03-21 DIAGNOSIS — Z23 Encounter for immunization: Secondary | ICD-10-CM | POA: Diagnosis not present

## 2019-03-21 MED ORDER — CLONIDINE HCL 0.1 MG PO TABS: 0.1000 mg | ORAL_TABLET | Freq: Two times a day (BID) | ORAL | 0 refills | Status: DC | PRN

## 2019-03-21 NOTE — Progress Notes (Signed)
BP (!) 148/102   Pulse 76   Temp 98.4 F (36.9 C) (Oral)   Wt 213 lb (96.6 kg)   SpO2 95%   BMI 34.38 kg/m    Subjective:    Patient ID: Clarence Ray, male    DOB: 1976-05-30, 43 y.o.   MRN: [id number]  HPI: Clarence E Closson is a 43 y.o. male  Chief Complaint  Patient presents with  . Hypertension    BP running high, pt been having headaches   2.5 weeks of chest fluttering, some SOB, some intermittent CP and elevated BPs. Now having headaches intermittently with this too. BPs at home have been running 150s-170s/100s on average the past few weeks. Compliant with his diltiazem, HCTZ regimen. Does note he's been under a lot of stress lately and has not been as active as he typically is. Known hx of paroxysmal tachycardia, followed by Cardiology. Was previously on amlodipine but hated the way it made him feel. Denies syncope, dizziness, diaphoresis.   Relevant past medical, surgical, family and social history reviewed and updated as indicated. Interim medical history since our last visit reviewed. Allergies and medications reviewed and updated.  Review of Systems  Per HPI unless specifically indicated above     Objective:    BP (!) 148/102   Pulse 76   Temp 98.4 F (36.9 C) (Oral)   Wt 213 lb (96.6 kg)   SpO2 95%   BMI 34.38 kg/m   Wt Readings from Last 3 Encounters:  03/21/19 213 lb (96.6 kg)  01/15/19 213 lb (96.6 kg)  12/17/18 213 lb (96.6 kg)    Physical Exam Vitals signs and nursing note reviewed.  Constitutional:      Appearance: Normal appearance.  HENT:     Head: Atraumatic.  Eyes:     Extraocular Movements: Extraocular movements intact.     Conjunctiva/sclera: Conjunctivae normal.  Neck:     Musculoskeletal: Normal range of motion and neck supple.  Cardiovascular:     Rate and Rhythm: Normal rate and regular rhythm.  Pulmonary:     Effort: Pulmonary effort is normal.     Breath sounds: Normal breath sounds.  Musculoskeletal: Normal range of  motion.  Skin:    General: Skin is warm and dry.  Neurological:     General: No focal deficit present.     Mental Status: He is oriented to person, place, and time.  Psychiatric:        Mood and Affect: Mood normal.        Thought Content: Thought content normal.        Judgment: Judgment normal.     Results for orders placed or performed in visit on 01/25/19  HIV Antibody (routine testing w rflx)  Result Value Ref Range   HIV Screen 4th Generation wRfx Non Reactive Non Reactive  UA/M w/rflx Culture, Routine   Specimen: Urine   URINE  Result Value Ref Range   Specific Gravity, UA 1.020 1.005 - 1.030   pH, UA 7.5 5.0 - 7.5   Color, UA Yellow Yellow   Appearance Ur Clear Clear   Leukocytes,UA Negative Negative   Protein,UA Negative Negative/Trace   Glucose, UA Negative Negative   Ketones, UA Negative Negative   RBC, UA Negative Negative   Bilirubin, UA Negative Negative   Urobilinogen, Ur 0.2 0.2 - 1.0 mg/dL   Nitrite, UA Negative Negative  TSH  Result Value Ref Range   TSH 1.070 0.450 - 4.500 uIU/mL  Microalbumin, Urine  Waived  Result Value Ref Range   Microalb, Ur Waived 80 (H) 0 - 19 mg/L   Creatinine, Urine Waived 50 10 - 300 mg/dL   Microalb/Creat Ratio 30-300 (H) <30 mg/g  Lipid Panel w/o Chol/HDL Ratio  Result Value Ref Range   Cholesterol, Total 226 (H) 100 - 199 mg/dL   Triglycerides 94 0 - 149 mg/dL   HDL 36 (L) >14>39 mg/dL   VLDL Cholesterol Cal 19 5 - 40 mg/dL   LDL Calculated 782171 (H) 0 - 99 mg/dL  CBC with Differential/Platelet  Result Value Ref Range   WBC 6.0 3.4 - 10.8 x10E3/uL   RBC 5.51 4.14 - 5.80 x10E6/uL   Hemoglobin 16.3 13.0 - 17.7 g/dL   Hematocrit 95.647.8 21.337.5 - 51.0 %   MCV 87 79 - 97 fL   MCH 29.6 26.6 - 33.0 pg   MCHC 34.1 31.5 - 35.7 g/dL   RDW 08.613.9 57.811.6 - 46.915.4 %   Platelets 243 150 - 450 x10E3/uL   Neutrophils 42 Not Estab. %   Lymphs 41 Not Estab. %   Monocytes 12 Not Estab. %   Eos 4 Not Estab. %   Basos 1 Not Estab. %    Neutrophils Absolute 2.5 1.4 - 7.0 x10E3/uL   Lymphocytes Absolute 2.5 0.7 - 3.1 x10E3/uL   Monocytes Absolute 0.7 0.1 - 0.9 x10E3/uL   EOS (ABSOLUTE) 0.2 0.0 - 0.4 x10E3/uL   Basophils Absolute 0.0 0.0 - 0.2 x10E3/uL   Immature Granulocytes 0 Not Estab. %   Immature Grans (Abs) 0.0 0.0 - 0.1 x10E3/uL  Comprehensive metabolic panel  Result Value Ref Range   Glucose 92 65 - 99 mg/dL   BUN 13 6 - 24 mg/dL   Creatinine, Ser 6.291.14 0.76 - 1.27 mg/dL   GFR calc non Af Amer 79 >59 mL/min/1.73   GFR calc Af Amer 92 >59 mL/min/1.73   BUN/Creatinine Ratio 11 9 - 20   Sodium 143 134 - 144 mmol/L   Potassium 3.9 3.5 - 5.2 mmol/L   Chloride 101 96 - 106 mmol/L   CO2 24 20 - 29 mmol/L   Calcium 10.1 8.7 - 10.2 mg/dL   Total Protein 7.8 6.0 - 8.5 g/dL   Albumin 5.3 (H) 4.0 - 5.0 g/dL   Globulin, Total 2.5 1.5 - 4.5 g/dL   Albumin/Globulin Ratio 2.1 1.2 - 2.2   Bilirubin Total 0.4 0.0 - 1.2 mg/dL   Alkaline Phosphatase 91 39 - 117 IU/L   AST 22 0 - 40 IU/L   ALT 20 0 - 44 IU/L  Hgb A1c w/o eAG  Result Value Ref Range   Hgb A1c MFr Bld 6.0 (H) 4.8 - 5.6 %  Specimen status report  Result Value Ref Range   specimen status report Comment       Assessment & Plan:   Problem List Items Addressed This Visit      Cardiovascular and Mediastinum   Essential hypertension    Not at goal, will add prn clonidine up to BID depending on readings and get back with Cardiology in the next few weeks to discuss his sxs. Continue current regimen otherwise as before. Stress control, exercise, good diet reviewed.       Relevant Medications   cloNIDine (CATAPRES) 0.1 MG tablet     Other   Palpitations    Currently on diltiazem which seemed to keep things under good control up until several weeks ago. Recommended he follow up as soon as possible with  his Cardiologist for further management. Declines EKG today and will schedule with Cardiology for further testing in the next week or so. Knows to go call back or  to ER if sxs worsening in meantime.        Other Visit Diagnoses    Need for Tdap vaccination    -  Primary   Relevant Orders   Td : Tetanus/diphtheria >7yo Preservative  free (Completed)       Follow up plan: Return in about 2 weeks (around 04/04/2019) for BP f/u if not in with Cardiology yet.

## 2019-03-21 NOTE — Patient Instructions (Addendum)
Take the clonidine as needed up to twice a day if your blood pressures are over 140/90. If you become dizzy or your blood pressures read low (under 100/60) call the office right away or go to Urgent Care/ER if severe  Eat something salty and drink lots of fluids if you feel your blood pressure has dropped a little low, call the office right away.        Td Vaccine (Tetanus and Diphtheria): What You Need to Know 1. Why get vaccinated? Tetanus  and diphtheria are very serious diseases. They are rare in the [place] today, but people who do become infected often have severe complications. Td vaccine is used to protect adolescents and adults from both of these diseases. Both tetanus and diphtheria are infections caused by bacteria. Diphtheria spreads from person to person through coughing or sneezing. Tetanus-causing bacteria enter the body through cuts, scratches, or wounds. TETANUS (Lockjaw) causes painful muscle tightening and stiffness, usually all over the body.  It can lead to tightening of muscles in the head and neck so you can't open your mouth, swallow, or sometimes even breathe. Tetanus kills about 1 out of every 10 people who are infected even after receiving the best medical care. DIPHTHERIA can cause a thick coating to form in the back of the throat.  It can lead to breathing problems, paralysis, heart failure, and death. Before vaccines, as many as 200,000 cases of diphtheria and hundreds of cases of tetanus were reported in the [place] each year. Since vaccination began, reports of cases for both diseases have dropped by about 99%. 2. Td vaccine Td vaccine can protect adolescents and adults from tetanus and diphtheria. Td is usually given as a booster dose every 10 years but it can also be given earlier after a severe and dirty wound or burn. Another vaccine, called Tdap, which protects against pertussis in addition to tetanus and diphtheria, is sometimes recommended  instead of Td vaccine. Your doctor or the person giving you the vaccine can give you more information. Td may safely be given at the same time as other vaccines. 3. Some people should not get this vaccine  A person who has ever had a life-threatening allergic reaction after a previous dose of any tetanus or diphtheria containing vaccine, OR has a severe allergy to any part of this vaccine, should not get Td vaccine. Tell the person giving the vaccine about any severe allergies.  Talk to your doctor if you: ? had severe pain or swelling after any vaccine containing diphtheria or tetanus, ? ever had a condition called Guillain Barr Syndrome (GBS), ? aren't feeling well on the day the shot is scheduled. 4. Risks of a vaccine reaction With any medicine, including vaccines, there is a chance of side effects. These are usually mild and go away on their own. Serious reactions are also possible but are rare. Most people who get Td vaccine do not have any problems with it. Mild Problems following Td vaccine: (Did not interfere with activities)  Pain where the shot was given (about 8 people in 10)  Redness or swelling where the shot was given (about 1 person in 4)  Mild fever (rare)  Headache (about 1 person in 4)  Tiredness (about 1 person in 4) Moderate Problems following Td vaccine: (Interfered with activities, but did not require medical attention)  Fever over 102F (rare) Severe Problems following Td vaccine: (Unable to perform usual activities; required medical attention)  Swelling, severe pain, bleeding  and/or redness in the arm where the shot was given (rare). Problems that could happen after any vaccine:  People sometimes faint after a medical procedure, including vaccination. Sitting or lying down for about 15 minutes can help prevent fainting, and injuries caused by a fall. Tell your doctor if you feel dizzy, or have vision changes or ringing in the ears.  Some people get  severe pain in the shoulder and have difficulty moving the arm where a shot was given. This happens very rarely.  Any medication can cause a severe allergic reaction. Such reactions from a vaccine are very rare, estimated at fewer than 1 in a million doses, and would happen within a few minutes to a few hours after the vaccination. As with any medicine, there is a very remote chance of a vaccine causing a serious injury or death. The safety of vaccines is always being monitored. For more information, visit: http://floyd.org/www.cdc.gov/vaccinesafety/ 5. What if there is a serious reaction? What should I look for?  Look for anything that concerns you, such as signs of a severe allergic reaction, very high fever, or unusual behavior. Signs of a severe allergic reaction can include hives, swelling of the face and throat, difficulty breathing, a fast heartbeat, dizziness, and weakness. These would usually start a few minutes to a few hours after the vaccination. What should I do?  If you think it is a severe allergic reaction or other emergency that can't wait, call 9-1-1 or get the person to the nearest hospital. Otherwise, call your doctor.  Afterward, the reaction should be reported to the Vaccine Adverse Event Reporting System (VAERS). Your doctor might file this report, or you can do it yourself through the VAERS web site at www.vaers.LAgents.nohhs.gov, or by calling 1-(539)181-9942. VAERS does not give medical advice. 6. The National Vaccine Injury Compensation Program The [employer] Vaccine Injury Compensation Program (VICP) is a federal program that was created to compensate people who may have been injured by certain vaccines. Persons who believe they may have been injured by a vaccine can learn about the program and about filing a claim by calling 1-931-443-3456 or visiting the VICP website at SpiritualWord.atwww.hrsa.gov/vaccinecompensation. There is a time limit to file a claim for compensation. 7. How can I learn more?  Ask your  doctor. He or she can give you the vaccine package insert or suggest other sources of information.  Call your local or state health department.  Contact the Centers for Disease Control and Prevention (CDC): ? Call 712-832-42751-(626)618-4182 (1-800-CDC-INFO) ? Visit CDC's website at PicCapture.uywww.cdc.gov/vaccines Vaccine Information Statement Td Vaccine (12/08/15) This information is not intended to replace advice given to you by your health care provider. Make sure you discuss any questions you have with your health care provider. Document Released: 06/12/2006 Document Revised: 04/02/2018 Document Reviewed: 04/02/2018 Elsevier Interactive Patient Education  [employer]2020 [employer]

## 2019-03-25 ENCOUNTER — Other Ambulatory Visit: Payer: Self-pay | Admitting: *Deleted

## 2019-03-25 MED ORDER — DILTIAZEM HCL 30 MG PO TABS: 30.0000 mg | ORAL_TABLET | Freq: Three times a day (TID) | ORAL | 3 refills | Status: DC | PRN

## 2019-03-28 NOTE — Assessment & Plan Note (Signed)
Not at goal, will add prn clonidine up to BID depending on readings and get back with Cardiology in the next few weeks to discuss his sxs. Continue current regimen otherwise as before. Stress control, exercise, good diet reviewed.

## 2019-03-28 NOTE — Assessment & Plan Note (Addendum)
Currently on diltiazem which seemed to keep things under good control up until several weeks ago. Recommended he follow up as soon as possible with his Cardiologist for further management. Declines EKG today and will schedule with Cardiology for further testing in the next week or so. Knows to go call back or to ER if sxs worsening in meantime.

## 2019-04-02 NOTE — Progress Notes (Signed)
Cardiology Office Note    Date:  04/05/2019   ID:  Clarence Ray, DOB 1976-06-03, MRN [id number]  PCP:  Valerie Roys, DO  Cardiologist:  Ida Rogue, MD  Electrophysiologist:  None   Chief Complaint: Follow up  History of Present Illness:   Clarence Ray is a 42 y.o. male with history of paroxysmal SVT, alcohol abuse, tobacco abuse, HTN with renal artery ultrasound in 09/2017 negative for RAS, and HLD with intolerance to Lipitor who presents for follow-up of hypertension.  Patient was initially seen by Dr. Rockey Situ in 11/2017 for evaluation of hypertension and palpitations that dated back to 04/2017.  It appeared his palpitations were commonly associated with heavy alcohol use.  Zio monitor in 11/2017 showed NSR with an average heart rate of 84 bpm (minimum 48 bpm, maximum 174 bpm), 2 SVT runs occurred with the fastest interval lasting 4 beats with a maximum rate of 174 bpm and the longest interval lasting 10 beats with an average rate of 112 bpm.  SVT was detected with symptomatic patient events.  Isolated PACs and PVCs were noted.  Patient was started on generic diltiazem though did not feel well.  He seems to have tolerated Cardia XT well.  At his last visit, his weight was up 2 pounds when compared to his 11/2017 visit (212--> 214 pounds).  His blood pressure was still poorly controlled at 148/102.  In this setting, his cardia XT was titrated to 180 mg daily and amlodipine appears to have been changed to 5 mg as needed for diastolic greater than 90 mmHg.  Patient contacted our office in 08/2018 noting continued elevations in blood pressure, chest discomfort, and palpitations.  In the setting his Cardia XT was increased to 240 mg daily and he was continued on HCTZ.  Patient was seen by PCP in 11/2018 with continued elevation in blood pressure leading to increase in his Cardia XT to 360 mg daily.  More recently, the p blood pressure at home with weight loss and decreased alcohol consumption is  improved to typically 130s over 70s to 80s.  [place] was seen at outside office in late 02/2019 with continued poorly controlled blood pressure and prescribed clonidine 0.1 mg twice daily as needed.  Patient's primary cardiologist reviewed patient's My Chart message regarding this and recommended the patient take clonidine 0.1 mg twice daily and stay on diltiazem 360 mg daily.  Patient comes in doing well today.  He actually states over the past several weeks he has felt better than he has in a long time.  He comes in today mainly to discuss his medication regimen.  He denies any chest pain, shortness of breath, palpitations, dizziness, presyncope, syncope.  No lower extremity swelling, abdominal tension, orthopnea, PND, early satiety.  No falls since he was last seen.  No BRBPR or melena.  He does note some intermittent headaches.  He never did start the as needed clonidine from 02/2019.  Patient has lost 8 pounds since his last visit, this was intentional.  He has significantly cut back on his alcohol intake and drinks sometimes 1-3 beers daily and sometimes none.  He drinks 1 cup of coffee daily.  He has been without tobacco use for almost 2 years now.  No prior sleep study.  Patient does indicate his wife has told him he snores and he has previously had apneic episodes.  Patient is unaware of diagnosis of hypertension and is siblings or mother at a young age.  Blood  pressure at home with weight loss and decreased alcohol consumption has improved to the 130s over 70s to 80s.  Labs: 12/2018 -TSH normal, total cholesterol 226, triglyceride 94, HDL 36, LDL 171, serum creatinine 1.321.14, potassium 3.9, albumin 5.3, AST/ALT normal, Hgb 16.3, A1c 6.0  Past Medical History:  Diagnosis Date   Hyperlipidemia    Hypertension    SVT (supraventricular tachycardia) (HCC)     History reviewed. No pertinent surgical history.  Current Medications: Current Meds  Medication Sig   albuterol (PROVENTIL HFA;VENTOLIN  HFA) 108 (90 Base) MCG/ACT inhaler Inhale 2 puffs into the lungs every 6 (six) hours as needed for wheezing or shortness of breath.   cloNIDine (CATAPRES) 0.1 MG tablet Take 1 tablet (0.1 mg total) by mouth 2 (two) times daily as needed.   diltiazem (CARDIZEM CD) 360 MG 24 hr capsule Take 1 capsule (360 mg total) by mouth daily. BRAND NAME NECESSARY CARTIA XT   diltiazem (CARDIZEM) 30 MG tablet Take 1 tablet (30 mg total) by mouth 3 (three) times daily as needed (As needed for palpitations or high blood pressure).   hydrochlorothiazide (HYDRODIURIL) 25 MG tablet Take 1 tablet (25 mg total) by mouth daily.     Allergies:   Atorvastatin and Losartan   Social History   Socioeconomic History   Marital status: Married    Spouse name: Not on file   Number of children: Not on file   Years of education: Not on file   Highest education level: Not on file  Occupational History   Not on file  Social Needs   Financial resource strain: Not on file   Food insecurity    Worry: Not on file    Inability: Not on file   Transportation needs    Medical: Not on file    Non-medical: Not on file  Tobacco Use   Smoking status: Former Smoker    Quit date: 06/19/2017    Years since quitting: 1.7   Smokeless tobacco: Never Used  Substance and Sexual Activity   Alcohol use: Yes    Comment: on occasion   Drug use: No   Sexual activity: Yes  Lifestyle   Physical activity    Days per week: Not on file    Minutes per session: Not on file   Stress: Not on file  Relationships   Social connections    Talks on phone: Not on file    Gets together: Not on file    Attends religious service: Not on file    Active member of club or organization: Not on file    Attends meetings of clubs or organizations: Not on file    Relationship status: Not on file  Other Topics Concern   Not on file  Social History Narrative   Not on file     Family History:  The patient's family history  includes Anxiety disorder in his sister; Fibromyalgia in his sister and sister; Heart disease in his mother; Hypertension in his mother; Stroke in his paternal grandmother.  ROS:   Review of Systems  Constitutional: Negative for chills, diaphoresis, fever, malaise/fatigue and weight loss.  HENT: Negative for congestion.   Eyes: Negative for discharge and redness.  Respiratory: Negative for cough, hemoptysis, sputum production, shortness of breath and wheezing.   Cardiovascular: Negative for chest pain, palpitations, orthopnea, claudication, leg swelling and PND.  Gastrointestinal: Negative for abdominal pain, blood in stool, heartburn, melena, nausea and vomiting.  Genitourinary: Negative for hematuria.  Musculoskeletal: Negative for  falls and myalgias.  Skin: Negative for rash.  Neurological: Positive for headaches. Negative for dizziness, tingling, tremors, sensory change, speech change, focal weakness, loss of consciousness and weakness.  Endo/Heme/Allergies: Does not bruise/bleed easily.  Psychiatric/Behavioral: Negative for substance abuse. The patient is nervous/anxious.   All other systems reviewed and are negative.    EKGs/Labs/Other Studies Reviewed:    Studies reviewed were summarized above. The additional studies were reviewed today:  Zio 11/2017: Event monitor Normal sinus rhythm min HR of 48 bpm, max HR of 174 bpm, and avg HR of 84 bpm.  2 Supraventricular Tachycardia runs occurred, the run with the fastest interval lasting 4 beats with a max rate of 174 bpm, the longest lasting 10 beats with an avg rate of 112 bpm.  Supraventricular Tachycardia was detected within +/- 45 seconds of symptomatic patient event(s). Isolated SVEs were rare (<1.0%), and no SVE Couplets or SVE Triplets were present. Isolated VEs were rare (<1.0%), and no VE Couplets or VE Triplets were present.   EKG:  EKG is ordered today.  The EKG ordered today demonstrates NSR, 67 bpm, nonspecific ST-T  changes involving inferolateral leads (unchanged from prior)  Recent Labs: 01/25/2019: ALT 20; BUN 13; Creatinine, Ser 1.14; Hemoglobin 16.3; Platelets 243; Potassium 3.9; Sodium 143; TSH 1.070  Recent Lipid Panel    Component Value Date/Time   CHOL 226 (H) 01/25/2019 0920   TRIG 94 01/25/2019 0920   HDL 36 (L) 01/25/2019 0920   LDLCALC 171 (H) 01/25/2019 0920    PHYSICAL EXAM:    VS:  BP 126/80 (BP Location: Left Arm, Patient Position: Sitting, Cuff Size: Normal)    Pulse 74    Ht 5\' 6"  (1.676 m)    Wt 206 lb 12.8 oz (93.8 kg)    SpO2 98%    BMI 33.38 kg/m   BMI: Body mass index is 33.38 kg/m.  Physical Exam  Constitutional: He is oriented to person, place, and time. He appears well-developed and well-nourished.  HENT:  Head: Normocephalic and atraumatic.  Eyes: Right eye exhibits no discharge. Left eye exhibits no discharge.  Neck: Normal range of motion. No JVD present.  Cardiovascular: Normal rate, regular rhythm, S1 normal, S2 normal and normal heart sounds. Exam reveals no distant heart sounds, no friction rub, no midsystolic click and no opening snap.  No murmur heard. Pulses:      Posterior tibial pulses are 2+ on the right side and 2+ on the left side.  Pulmonary/Chest: Effort normal and breath sounds normal. No respiratory distress. He has no decreased breath sounds. He has no wheezes. He has no rales. He exhibits no tenderness.  Abdominal: Soft. He exhibits no distension. There is no abdominal tenderness.  Musculoskeletal:        General: No edema.  Neurological: He is alert and oriented to person, place, and time.  Skin: Skin is warm and dry. No cyanosis. Nails show no clubbing.  Psychiatric: He has a normal mood and affect. His speech is normal and behavior is normal. Judgment and thought content normal.    Wt Readings from Last 3 Encounters:  04/05/19 206 lb 12.8 oz (93.8 kg)  03/21/19 213 lb (96.6 kg)  01/15/19 213 lb (96.6 kg)     ASSESSMENT & PLAN:    1. Hypertension: Blood pressure is well controlled today.  Patient has noted improvement in blood pressure readings with decreased alcohol consumption and decreased weight.  He does report a long history of snoring with noted apneic episodes  per his wife.  In this setting I have recommended referral to pulmonology for consideration of sleep study.  Perhaps this will also help with his hypertension.  He has not needed any as needed clonidine.  In this setting, we have gone ahead and discontinued this medication.  He will continue Cartia XT 360 mg daily along with HCTZ 25 mg daily.  I have prescribed hydralazine 25 mg 3 times daily as needed blood pressure greater than 140/90.  Continue to decrease alcohol consumption.  Weight loss is recommended.  Low-sodium diet recommended.  2. Paroxysmal SVT: No recent palpitations.  Continue Cartia XT as above.  Prior cardiac monitoring noted as above.  3. Hyperlipidemia: LDL of 171 from 12/2018.  Followed by PCP.  Could consider calcium score for further risk ratification.  This will be deferred to patient's primary cardiologist.  Would not start statin prior to undergoing calcium scoring as statins can stabilize potential unstable plaque thus leading to falsely elevated calcium score.  4. Sleep disordered breathing: Refer to pulmonology for sleep study.  Disposition: F/u with Dr. Mariah MillingGollan in 2 months.   Medication Adjustments/Labs and Tests Ordered: Current medicines are reviewed at length with the patient today.  Concerns regarding medicines are outlined above. Medication changes, Labs and Tests ordered today are summarized above and listed in the Patient Instructions accessible in Encounters.   Signed, Eula Listenyan Kwanza Cancelliere, PA-C 04/05/2019 11:29 AM     [facility] [place] [address] [place], [place] [address] [phone]

## 2019-04-04 ENCOUNTER — Encounter: Payer: Self-pay | Admitting: Physician Assistant

## 2019-04-05 ENCOUNTER — Other Ambulatory Visit: Payer: Self-pay

## 2019-04-05 ENCOUNTER — Encounter: Payer: Self-pay | Admitting: Physician Assistant

## 2019-04-05 ENCOUNTER — Ambulatory Visit (INDEPENDENT_AMBULATORY_CARE_PROVIDER_SITE_OTHER): Payer: BLUE CROSS/BLUE SHIELD | Admitting: Physician Assistant

## 2019-04-05 VITALS — BP 126/80 | HR 74 | Ht 66.0 in | Wt 206.8 lb

## 2019-04-05 DIAGNOSIS — I479 Paroxysmal tachycardia, unspecified: Secondary | ICD-10-CM | POA: Diagnosis not present

## 2019-04-05 DIAGNOSIS — G4730 Sleep apnea, unspecified: Secondary | ICD-10-CM | POA: Diagnosis not present

## 2019-04-05 DIAGNOSIS — E782 Mixed hyperlipidemia: Secondary | ICD-10-CM | POA: Diagnosis not present

## 2019-04-05 DIAGNOSIS — I10 Essential (primary) hypertension: Secondary | ICD-10-CM | POA: Diagnosis not present

## 2019-04-05 MED ORDER — HYDRALAZINE HCL 25 MG PO TABS: ORAL_TABLET | ORAL | 3 refills | Status: DC

## 2019-04-05 NOTE — Patient Instructions (Signed)
Medication Instructions:  - Your physician has recommended you make the following change in your medication:   1) START hydralazine 25 mg- take 1 tablet by mouth three times a day as needed for blood pressure > 140/90  If you need a refill on your cardiac medications before your next appointment, please call your pharmacy.   Lab work: - none ordered  If you have labs (blood work) drawn today and your tests are completely normal, you will receive your results only by: Marland Kitchen MyChart Message (if you have MyChart) OR . A paper copy in the mail If you have any lab test that is abnormal or we need to change your treatment, we will call you to review the results.  Testing/Procedures: - You have been referred to : [facility] for evaluation of a sleep study  Follow-Up: At [facility], you and your health needs are our priority.  As part of our continuing mission to provide you with exceptional heart care, we have created designated Provider Care Teams.  These Care Teams include your primary Cardiologist (physician) and Advanced Practice Providers (APPs -  Physician Assistants and Nurse Practitioners) who all work together to provide you with the care you need, when you need it. . in 2 months with Dr. Dayton Martes, PA  Any Other Special Instructions Will Be Listed Below (If Applicable). - N/A

## 2019-04-25 ENCOUNTER — Encounter: Payer: Self-pay | Admitting: Family Medicine

## 2019-04-25 ENCOUNTER — Other Ambulatory Visit: Payer: Self-pay | Admitting: Family Medicine

## 2019-04-25 MED ORDER — DILTIAZEM HCL ER COATED BEADS 180 MG PO CP24: 360.0000 mg | ORAL_CAPSULE | Freq: Every day | ORAL | 1 refills | Status: DC

## 2019-05-03 ENCOUNTER — Ambulatory Visit: Payer: BLUE CROSS/BLUE SHIELD | Admitting: Family Medicine

## 2019-05-03 ENCOUNTER — Encounter: Payer: Self-pay | Admitting: Family Medicine

## 2019-05-03 ENCOUNTER — Other Ambulatory Visit: Payer: Self-pay

## 2019-05-03 VITALS — BP 135/89 | HR 76 | Temp 98.5°F | Ht 66.0 in | Wt 206.0 lb

## 2019-05-03 DIAGNOSIS — R1030 Lower abdominal pain, unspecified: Secondary | ICD-10-CM | POA: Diagnosis not present

## 2019-05-03 DIAGNOSIS — Z23 Encounter for immunization: Secondary | ICD-10-CM

## 2019-05-03 LAB — UA/M W/RFLX CULTURE, ROUTINE
Bilirubin, UA: NEGATIVE
Glucose, UA: NEGATIVE
Ketones, UA: NEGATIVE
Leukocytes,UA: NEGATIVE
Nitrite, UA: NEGATIVE
Protein,UA: NEGATIVE
RBC, UA: NEGATIVE
Specific Gravity, UA: 1.01 (ref 1.005–1.030)
Urobilinogen, Ur: 0.2 mg/dL (ref 0.2–1.0)
pH, UA: 7 (ref 5.0–7.5)

## 2019-05-03 MED ORDER — CIPROFLOXACIN HCL 250 MG PO TABS: 250.0000 mg | ORAL_TABLET | Freq: Two times a day (BID) | ORAL | 0 refills | Status: DC

## 2019-05-03 NOTE — Progress Notes (Signed)
BP 135/89   Pulse 76   Temp 98.5 F (36.9 C) (Oral)   Ht 5\' 6"  (1.676 m)   Wt 206 lb (93.4 kg)   SpO2 96%   BMI 33.25 kg/m    Subjective:    Patient ID: Clarence Ray, male    DOB: 08/21/1976, 43 y.o.   MRN: [id number]  HPI: Clarence E Mccarter is a 43 y.o. male  Chief Complaint  Patient presents with  . Abdominal Pain    lower abdomen pain before and after urination x about 3-4 days ago.    Lower abdominal pain before and after urination the past 3-4 days. Sometimes having difficulty fully emptying bladder, and occasionally having some prostate dull achy sensations. Has had these episodes intermittently in the past and they've typically resolved spontaneously. Sxs much less today. Drinks plenty of fluids. Denies fevers, chills, N/V/D, constipation, flank pain, hematuria, penile discharge, lesions, exposures to STIs. Not trying anything OTC for sxs.   Relevant past medical, surgical, family and social history reviewed and updated as indicated. Interim medical history since our last visit reviewed. Allergies and medications reviewed and updated.  Review of Systems  Per HPI unless specifically indicated above     Objective:    BP 135/89   Pulse 76   Temp 98.5 F (36.9 C) (Oral)   Ht 5\' 6"  (1.676 m)   Wt 206 lb (93.4 kg)   SpO2 96%   BMI 33.25 kg/m   Wt Readings from Last 3 Encounters:  05/03/19 206 lb (93.4 kg)  04/05/19 206 lb 12.8 oz (93.8 kg)  03/21/19 213 lb (96.6 kg)    Physical Exam Vitals signs and nursing note reviewed.  Constitutional:      Appearance: Normal appearance.  HENT:     Head: Atraumatic.  Eyes:     Extraocular Movements: Extraocular movements intact.     Conjunctiva/sclera: Conjunctivae normal.  Neck:     Musculoskeletal: Normal range of motion and neck supple.  Cardiovascular:     Rate and Rhythm: Normal rate and regular rhythm.  Pulmonary:     Effort: Pulmonary effort is normal.     Breath sounds: Normal breath sounds.  Abdominal:      General: There is no distension.     Palpations: There is no mass.     Tenderness: There is no abdominal tenderness. There is no right CVA tenderness, left CVA tenderness or guarding.  Genitourinary:    Comments: Declines prostate/GU exam Musculoskeletal: Normal range of motion.  Skin:    General: Skin is warm and dry.  Neurological:     General: No focal deficit present.     Mental Status: He is oriented to person, place, and time.  Psychiatric:        Mood and Affect: Mood normal.        Thought Content: Thought content normal.        Judgment: Judgment normal.     Results for orders placed or performed in visit on 01/25/19  HIV Antibody (routine testing w rflx)  Result Value Ref Range   HIV Screen 4th Generation wRfx Non Reactive Non Reactive  UA/M w/rflx Culture, Routine   Specimen: Urine   URINE  Result Value Ref Range   Specific Gravity, UA 1.020 1.005 - 1.030   pH, UA 7.5 5.0 - 7.5   Color, UA Yellow Yellow   Appearance Ur Clear Clear   Leukocytes,UA Negative Negative   Protein,UA Negative Negative/Trace   Glucose, UA  Negative Negative   Ketones, UA Negative Negative   RBC, UA Negative Negative   Bilirubin, UA Negative Negative   Urobilinogen, Ur 0.2 0.2 - 1.0 mg/dL   Nitrite, UA Negative Negative  TSH  Result Value Ref Range   TSH 1.070 0.450 - 4.500 uIU/mL  Microalbumin, Urine Waived  Result Value Ref Range   Microalb, Ur Waived 80 (H) 0 - 19 mg/L   Creatinine, Urine Waived 50 10 - 300 mg/dL   Microalb/Creat Ratio 30-300 (H) <30 mg/g  Lipid Panel w/o Chol/HDL Ratio  Result Value Ref Range   Cholesterol, Total 226 (H) 100 - 199 mg/dL   Triglycerides 94 0 - 149 mg/dL   HDL 36 (L) >02 mg/dL   VLDL Cholesterol Cal 19 5 - 40 mg/dL   LDL Calculated 111 (H) 0 - 99 mg/dL  CBC with Differential/Platelet  Result Value Ref Range   WBC 6.0 3.4 - 10.8 x10E3/uL   RBC 5.51 4.14 - 5.80 x10E6/uL   Hemoglobin 16.3 13.0 - 17.7 g/dL   Hematocrit 55.2 08.0 - 51.0 %    MCV 87 79 - 97 fL   MCH 29.6 26.6 - 33.0 pg   MCHC 34.1 31.5 - 35.7 g/dL   RDW 22.3 36.1 - 22.4 %   Platelets 243 150 - 450 x10E3/uL   Neutrophils 42 Not Estab. %   Lymphs 41 Not Estab. %   Monocytes 12 Not Estab. %   Eos 4 Not Estab. %   Basos 1 Not Estab. %   Neutrophils Absolute 2.5 1.4 - 7.0 x10E3/uL   Lymphocytes Absolute 2.5 0.7 - 3.1 x10E3/uL   Monocytes Absolute 0.7 0.1 - 0.9 x10E3/uL   EOS (ABSOLUTE) 0.2 0.0 - 0.4 x10E3/uL   Basophils Absolute 0.0 0.0 - 0.2 x10E3/uL   Immature Granulocytes 0 Not Estab. %   Immature Grans (Abs) 0.0 0.0 - 0.1 x10E3/uL  Comprehensive metabolic panel  Result Value Ref Range   Glucose 92 65 - 99 mg/dL   BUN 13 6 - 24 mg/dL   Creatinine, Ser 4.97 0.76 - 1.27 mg/dL   GFR calc non Af Amer 79 >59 mL/min/1.73   GFR calc Af Amer 92 >59 mL/min/1.73   BUN/Creatinine Ratio 11 9 - 20   Sodium 143 134 - 144 mmol/L   Potassium 3.9 3.5 - 5.2 mmol/L   Chloride 101 96 - 106 mmol/L   CO2 24 20 - 29 mmol/L   Calcium 10.1 8.7 - 10.2 mg/dL   Total Protein 7.8 6.0 - 8.5 g/dL   Albumin 5.3 (H) 4.0 - 5.0 g/dL   Globulin, Total 2.5 1.5 - 4.5 g/dL   Albumin/Globulin Ratio 2.1 1.2 - 2.2   Bilirubin Total 0.4 0.0 - 1.2 mg/dL   Alkaline Phosphatase 91 39 - 117 IU/L   AST 22 0 - 40 IU/L   ALT 20 0 - 44 IU/L  Hgb A1c w/o eAG  Result Value Ref Range   Hgb A1c MFr Bld 6.0 (H) 4.8 - 5.6 %  Specimen status report  Result Value Ref Range   specimen status report Comment       Assessment & Plan:   Problem List Items Addressed This Visit    None    Visit Diagnoses    Lower abdominal pain    -  Primary   Possible prostatis, but sxs improving and U/A benign. Continue fluids, rest. Abx sent if worsening over weekend. Repeat U/A next week if worsening again   Relevant Orders  UA/M w/rflx Culture, Routine   UA/M w/rflx Culture, Routine   Flu vaccine need       Relevant Orders   Flu Vaccine QUAD 36+ mos IM (Completed)       Follow up plan: Return if  symptoms worsen or fail to improve.

## 2019-05-24 ENCOUNTER — Telehealth: Payer: Self-pay | Admitting: *Deleted

## 2019-05-24 NOTE — Telephone Encounter (Signed)
Hello,  The past few weeks, I've been experiencing off and on chest pains. Sometimes the pain is on the left side, sometimes the right, and other times it's across the whole chest or just in the middle. The pain levels vary. Sometimes it's mild (around a 2-3), and other times it's more prominent (around 6-7). I am wanting to know if you have any appointments available for this upcoming week, preferably in the morning. If you do not have any appointments available for this upcoming week, what about the following week? Thanks and I look forward to hearing from you.  Clarence Ray

## 2019-05-24 NOTE — Telephone Encounter (Signed)
Patient has appointment with Dr End in DOD slot on 05/27/19.

## 2019-05-24 NOTE — Telephone Encounter (Signed)
Message below copied from my chart.  I spoke with Clarence Ray who reports he has been having chest pain off and on for years. Seems to be more frequent in last 3 weeks. Sometimes it will last all day and other times it will last a few minutes. Will sometimes occur when pushing his mother in law in her wheelchair. Other times it will happen randomly. Last episode was when he was helping his mother in law up the stairs. Pain can be left sided, right sided and across the chest.  Feels like it is in chest muscle. Not related to arm movement. Worse when taking a deep breath. Three weeks ago he had pain all day and took Nexium.  He fell asleep and pain was gone when he woke up.  This pain is not usually associated with eating. I told Clarence Ray I would look into openings in [place] office and he would be contacted back.

## 2019-05-26 NOTE — Progress Notes (Signed)
Follow-up Outpatient Visit Date: 05/27/2019  Primary Care Provider: Valerie Roys, DO [address] [place] [address]   Primary Cardiologist: Esmond Plants, MD PhD  Chief Complaint: Chest pain and shortness of breath  HPI:  Clarence Ray is a 43 y.o. year-old male with history of PSVT, hypertension, and hyperlipidemia, who presents for urgent evaluation of chest pain.  He was last seen by Clarence Faith, PA, on 04/05/2019, at which time he felt well.  He contacted our office last week complaining of "off and on chest pains."  Today, Clarence Ray reports that he has been experiencing chest pain off and on for years but notes that it has become worse over the last few months.  Some days, will be right-sided whereas at other times it is left-sided.  Occasionally, it will involve the entire chest.  It has both tightness and stabbing qualities and can last anywhere from a few seconds to all day.  He has taken both esoomeprazole and ibuprofen with some relief in the past.  The discomfort has a maximal intensity of 6-7/10 and is not related to any particular activity.  However, he has been experiencing some of the chest discomfort when pushing his mother-in-law in a wheelchair.  He has chronic shortness of breath, though it seems to be worsened with the aforementioned chest pain.  He also notes occasional nausea and lightheadedness, though this is not consistently related to his chest pain.  He denies palpitations and edema.  Clarence Ray reports having undergone a stress test in his 13s for left-sided chest pain.  He believes it was normal.  He otherwise has not undergone ischemia evaluation.  In the past, he has been told that his exertional dyspnea was due to being overweight, though he is somewhat perplexed by this given that the shortness of breath is present some weeks and absent other weeks.  A few weeks ago, he also noticed tingling and shakiness throughout his body with associated shortness of breath  when standing up.  This has resolved on its own.  Of note, he has been under more stress recently, as his mother-in-law was recently diagnosed with cancer.  --------------------------------------------------------------------------------------------------  Past Medical History:  Diagnosis Date  . Hyperlipidemia   . Hypertension   . SVT (supraventricular tachycardia) ([place])    History reviewed. No pertinent surgical history.  Current Meds  Medication Sig  . albuterol (PROVENTIL HFA;VENTOLIN HFA) 108 (90 Base) MCG/ACT inhaler Inhale 2 puffs into the lungs every 6 (six) hours as needed for wheezing or shortness of breath.  . diltiazem (CARDIZEM CD) 180 MG 24 hr capsule Take 2 capsules (360 mg total) by mouth daily. BRAND NAME NECESSARY CARTIA XT  . hydrALAZINE (APRESOLINE) 25 MG tablet Take 1 tablet (25 mg) by mouth three times a day as needed for a blood pressure > 140/90  . hydrochlorothiazide (HYDRODIURIL) 25 MG tablet Take 1 tablet (25 mg total) by mouth daily.    Allergies: Atorvastatin and Losartan  Social History   Tobacco Use  . Smoking status: Former Smoker    Quit date: 06/19/2017    Years since quitting: 1.9  . Smokeless tobacco: Never Used  Substance Use Topics  . Alcohol use: Yes    Comment: on occasion  . Drug use: No    Family History  Problem Relation Age of Onset  . Heart disease Mother   . Hypertension Mother   . Fibromyalgia Sister   . Anxiety disorder Sister   . Stroke Paternal  Grandmother   . Fibromyalgia Sister     Review of Systems: A 12-system review of systems was performed and was negative except as noted in the HPI.  --------------------------------------------------------------------------------------------------  Physical Exam: BP (!) 142/100 (BP Location: Left Arm, Patient Position: Sitting, Cuff Size: Normal)   Pulse 79   Temp (!) 97.2 F (36.2 C)   Ht 5\' 6"  (1.676 m)   Wt 205 lb 12 oz (93.3 kg)   BMI 33.21 kg/m   General: NAD.  HEENT: No conjunctival pallor or scleral icterus.  Facemask in place. Neck: Supple without lymphadenopathy, thyromegaly, JVD, or HJR. Lungs: Normal work of breathing. Clear to auscultation bilaterally without wheezes or crackles. Heart: Regular rate and rhythm without murmurs, rubs, or gallops. Non-displaced PMI. Abd: Bowel sounds present. Soft, NT/ND without hepatosplenomegaly Ext: No lower extremity edema. Radial, PT, and DP pulses are 2+ bilaterally. Skin: Warm and dry without rash.  EKG: Normal sinus rhythm with nonspecific T wave changes.  Lab Results  Component Value Date   WBC 6.0 01/25/2019   HGB 16.3 01/25/2019   HCT 47.8 01/25/2019   MCV 87 01/25/2019   PLT 243 01/25/2019    Lab Results  Component Value Date   NA 143 01/25/2019   K 3.9 01/25/2019   CL 101 01/25/2019   CO2 24 01/25/2019   BUN 13 01/25/2019   CREATININE 1.14 01/25/2019   GLUCOSE 92 01/25/2019   ALT 20 01/25/2019    Lab Results  Component Value Date   CHOL 226 (H) 01/25/2019   HDL 36 (L) 01/25/2019   LDLCALC 171 (H) 01/25/2019   TRIG 94 01/25/2019    --------------------------------------------------------------------------------------------------  ASSESSMENT AND PLAN: Atypical chest pain and shortness of breath: Chest pain has been longstanding but seems to have worsened over the last few months.  Clarence Ray also notes at least 2 years of shortness of breath that comes and goes.  Cardiac risk factors include hypertension, hyperlipidemia, obesity, and prior tobacco use.  Though he is relatively young, it is possible that some of his symptoms may be reflective of CAD.  EKG today demonstrates nonspecific T wave changes, which are new from his prior tracing in August.  I have recommended an empiric trial of esoomeprazole 20 mg daily, as Clarence Ray seems to have had some improvement with this in the past.  We will also obtain a pharmacologic myocardial perfusion stress test to exclude significant.   Hypertension: Blood pressure suboptimally controlled today, though likely affected by stress.  No medication changes today.  Follow-up: Return to clinic in 1 month.  Jarold Motto, MD 05/27/2019 9:37 AM

## 2019-05-27 ENCOUNTER — Other Ambulatory Visit: Payer: Self-pay

## 2019-05-27 ENCOUNTER — Ambulatory Visit: Payer: BLUE CROSS/BLUE SHIELD | Admitting: Internal Medicine

## 2019-05-27 ENCOUNTER — Encounter: Payer: Self-pay | Admitting: Internal Medicine

## 2019-05-27 VITALS — BP 142/100 | HR 79 | Temp 97.2°F | Ht 66.0 in | Wt 205.8 lb

## 2019-05-27 DIAGNOSIS — I10 Essential (primary) hypertension: Secondary | ICD-10-CM | POA: Diagnosis not present

## 2019-05-27 DIAGNOSIS — R0602 Shortness of breath: Secondary | ICD-10-CM

## 2019-05-27 DIAGNOSIS — R0789 Other chest pain: Secondary | ICD-10-CM

## 2019-05-27 MED ORDER — ESOMEPRAZOLE MAGNESIUM 20 MG PO CPDR: 20.0000 mg | DELAYED_RELEASE_CAPSULE | Freq: Every day | ORAL | 2 refills | Status: DC

## 2019-05-27 NOTE — Patient Instructions (Addendum)
Medication Instructions:  Your physician has recommended you make the following change in your medication:  1- START Nexium 20 mg by mouth once a day.  If you need a refill on your cardiac medications before your next appointment, please call your pharmacy.   Lab work: COVID PRE- TEST: You will need a COVID TEST prior to the procedure:  [facility].  DATE/TIME:  ______________________________  If you have labs (blood work) drawn today and your tests are completely normal, you will receive your results only by: Marland Kitchen. MyChart Message (if you have MyChart) OR . A paper copy in the mail If you have any lab test that is abnormal or we need to change your treatment, we will call you to review the results.  Testing/Procedures: Your physician has requested that you have a lexiscan myoview. For further information please visit [email or web address]. Please follow instruction sheet, as given.  [facility] MYOVIEW  Your caregiver has ordered a Stress Test with nuclear imaging. The purpose of this test is to evaluate the blood supply to your heart muscle. This procedure is referred to as a "Non-Invasive Stress Test." This is because other than having an IV started in your vein, nothing is inserted or "invades" your body. Cardiac stress tests are done to find areas of poor blood flow to the heart by determining the extent of coronary artery disease (CAD). Some patients exercise on a treadmill, which naturally increases the blood flow to your heart, while others who are  unable to walk on a treadmill due to physical limitations have a pharmacologic/chemical stress agent called Lexiscan . This medicine will mimic walking on a treadmill by temporarily increasing your coronary blood flow.   Please note: these test may take anywhere between 2-4 hours to complete  PLEASE REPORT TO [facility]  THE VOLUNTEERS AT THE FIRST DESK WILL DIRECT YOU WHERE TO GO  Date  of Procedure:_____________________________________  Arrival Time for Procedure:______________________________    PLEASE NOTIFY THE OFFICE AT LEAST 24 HOURS IN ADVANCE IF YOU ARE UNABLE TO KEEP YOUR APPOINTMENT.  [phone] AND  PLEASE NOTIFY NUCLEAR MEDICINE AT [facility] AT LEAST 24 HOURS IN ADVANCE IF YOU ARE UNABLE TO KEEP YOUR APPOINTMENT. [phone]  How to prepare for your Myoview test:  1. Do not eat or drink after midnight 2. No caffeine for 24 hours prior to test 3. No smoking 24 hours prior to test. 4. Your medication may be taken with water.  If your doctor stopped a medication because of this test, do not take that medication. 5. Ladies, please do not wear dresses.  Skirts or pants are appropriate. Please wear a short sleeve shirt. 6. No perfume, cologne or lotion. 7. Wear comfortable walking shoes.   Follow-Up: At [facility], you and your health needs are our priority.  As part of our continuing mission to provide you with exceptional heart care, we have created designated Provider Care Teams.  These Care Teams include your primary Cardiologist (physician) and Advanced Practice Providers (APPs -  Physician Assistants and Nurse Practitioners) who all work together to provide you with the care you need, when you need it. You will need a follow up appointment in 1 months with Dr Mariah MillingGollan or APP.  Please call our office 2 months in advance to schedule this appointment.  You may see Julien Nordmannimothy Gollan, MD or one of the following Advanced Practice Providers on your designated Care Team:   Nicolasa Duckinghristopher Berge, NP Eula Listenyan Dunn,  PA-C . Marisue Ivan, PA-C    Cardiac Nuclear Scan A cardiac nuclear scan is a test that measures blood flow to the heart when a person is resting and when he or she is exercising. The test looks for problems such as:  Not enough blood reaching a portion of the heart.  The heart muscle not working normally. You may need this test if:  You have heart  disease.  You have had abnormal lab results.  You have had heart surgery or a balloon procedure to open up blocked arteries (angioplasty).  You have chest pain.  You have shortness of breath. In this test, a radioactive dye (tracer) is injected into your bloodstream. After the tracer has traveled to your heart, an imaging device is used to measure how much of the tracer is absorbed by or distributed to various areas of your heart. This procedure is usually done at a hospital and takes 2-4 hours. Tell a health care provider about:  Any allergies you have.  All medicines you are taking, including vitamins, herbs, eye drops, creams, and over-the-counter medicines.  Any problems you or family members have had with anesthetic medicines.  Any blood disorders you have.  Any surgeries you have had.  Any medical conditions you have.  Whether you are pregnant or may be pregnant. What are the risks? Generally, this is a safe procedure. However, problems may occur, including:  Serious chest pain and heart attack. This is only a risk if the stress portion of the test is done.  Rapid heartbeat.  Sensation of warmth in your chest. This usually passes quickly.  Allergic reaction to the tracer. What happens before the procedure?  Ask your health care provider about changing or stopping your regular medicines. This is especially important if you are taking diabetes medicines or blood thinners.  Follow instructions from your health care provider about eating or drinking restrictions.  Remove your jewelry on the day of the procedure. What happens during the procedure?  An IV will be inserted into one of your veins.  Your health care provider will inject a small amount of radioactive tracer through the IV.  You will wait for 20-40 minutes while the tracer travels through your bloodstream.  Your heart activity will be monitored with an electrocardiogram (ECG).  You will lie down on an  exam table.  Images of your heart will be taken for about 15-20 minutes.  You may also have a stress test. For this test, one of the following may be done: ? You will exercise on a treadmill or stationary bike. While you exercise, your heart's activity will be monitored with an ECG, and your blood pressure will be checked. ? You will be given medicines that will increase blood flow to parts of your heart. This is done if you are unable to exercise.  When blood flow to your heart has peaked, a tracer will again be injected through the IV.  After 20-40 minutes, you will get back on the exam table and have more images taken of your heart.  Depending on the type of tracer used, scans may need to be repeated 3-4 hours later.  Your IV line will be removed when the procedure is over. The procedure may vary among health care providers and hospitals. What happens after the procedure?  Unless your health care provider tells you otherwise, you may return to your normal schedule, including diet, activities, and medicines.  Unless your health care provider tells you otherwise,  you may increase your fluid intake. This will help to flush the contrast dye from your body. Drink enough fluid to keep your urine pale yellow.  Ask your health care provider, or the department that is doing the test: ? When will my results be ready? ? How will I get my results? Summary  A cardiac nuclear scan measures the blood flow to the heart when a person is resting and when he or she is exercising.  Tell your health care provider if you are pregnant.  Before the procedure, ask your health care provider about changing or stopping your regular medicines. This is especially important if you are taking diabetes medicines or blood thinners.  After the procedure, unless your health care provider tells you otherwise, increase your fluid intake. This will help flush the contrast dye from your body.  After the procedure,  unless your health care provider tells you otherwise, you may return to your normal schedule, including diet, activities, and medicines. This information is not intended to replace advice given to you by your health care provider. Make sure you discuss any questions you have with your health care provider. Document Released: 09/09/2004 Document Revised: 01/29/2018 Document Reviewed: 01/29/2018 Elsevier Patient Education  2020 [employer].

## 2019-05-30 ENCOUNTER — Telehealth: Payer: Self-pay | Admitting: *Deleted

## 2019-05-30 NOTE — Telephone Encounter (Signed)
Pt requiring PA for Esomeprazole 20 mg tablet 1 tablet qd.  PA has been faxed to [facility] awaiting approval.

## 2019-05-31 NOTE — Telephone Encounter (Signed)
Please advise him to purchase esomeprazole 20 mg daily over the counter.  Thanks.  Nelva Bush, MD [facility] Pager: [phone]

## 2019-05-31 NOTE — Telephone Encounter (Signed)
LMTCB

## 2019-05-31 NOTE — Telephone Encounter (Addendum)
Prior Authorization has been denied for Esomeprazole 20 mg through [email or web address] with [facility] due to the medication is not on the formulary and is approved when two alternative medications on the member's formulary have been tried and did not work.  Alternative medications include esomeprazole 40 mg, lansoprazole capsules, omeprazole capsules and  rabeprazole.   Please review and contact the  patient with instructions on a new RX.

## 2019-06-03 NOTE — Telephone Encounter (Signed)
Called patient and he said he had already picked up some over the counter over the weekend. He was appreciative.

## 2019-06-07 NOTE — Telephone Encounter (Signed)
Needs appt

## 2019-06-10 ENCOUNTER — Ambulatory Visit: Payer: BLUE CROSS/BLUE SHIELD | Admitting: Family Medicine

## 2019-06-10 NOTE — Telephone Encounter (Signed)
Spoke with pt and he stated that he will call back to schedule when he is able to come in office to be seen.

## 2019-06-12 ENCOUNTER — Telehealth: Payer: BLUE CROSS/BLUE SHIELD | Admitting: Cardiovascular Disease

## 2019-06-21 ENCOUNTER — Encounter: Payer: Self-pay | Admitting: Family Medicine

## 2019-06-21 ENCOUNTER — Ambulatory Visit (INDEPENDENT_AMBULATORY_CARE_PROVIDER_SITE_OTHER): Payer: BLUE CROSS/BLUE SHIELD | Admitting: Family Medicine

## 2019-06-21 ENCOUNTER — Other Ambulatory Visit: Payer: Self-pay

## 2019-06-21 VITALS — BP 157/101 | HR 77 | Temp 98.7°F

## 2019-06-21 DIAGNOSIS — R0602 Shortness of breath: Secondary | ICD-10-CM

## 2019-06-21 DIAGNOSIS — R002 Palpitations: Secondary | ICD-10-CM

## 2019-06-21 DIAGNOSIS — I10 Essential (primary) hypertension: Secondary | ICD-10-CM

## 2019-06-21 NOTE — Progress Notes (Signed)
BP (!) 157/101   Pulse 77   Temp 98.7 F (37.1 C)   SpO2 96%    Subjective:    Patient ID: Clarence Ray, male    DOB: 25-Jul-1976, 43 y.o.   MRN: [id number]  HPI: Clarence Ray is a 43 y.o. male  Chief Complaint  Patient presents with  . Chest Pain    Patient states that he will have pain under his right breast that at times goes to his shoulder blade and across his chest.    Right sided rib/chest pain that radiates down right side and posteriorly toward back off and on for months. Elevated BPs and nausea intermittently. Intermittent SOB that seems to come out of the blue and last several days to weeks, then leave spontaneously. Will be feeling his heart racing, become fatigued during episodes. Denies syncope during these episodes. Has known hx of SVT for which he is followed by Cardiology, saw specialist last month and due to f/u with them again regarding these issues in a couple of weeks. Was to have a stress test and echocardiogram performed but states the tests are incredibly costly so he has not been able to get those done. Was started on nexium to see if any of his sxs are reflux related but has not noticed benefit from them. Currently on cardizem for this. BP regimen currently is cardizem, HCTZ and hydralazine prn. Has not needed to take the hydralazine much up to this point. Denies abdominal pain, N/V/D, constipation, rib injuries, symptom association with eating.   Relevant past medical, surgical, family and social history reviewed and updated as indicated. Interim medical history since our last visit reviewed. Allergies and medications reviewed and updated.  Review of Systems  Per HPI unless specifically indicated above     Objective:    BP (!) 157/101   Pulse 77   Temp 98.7 F (37.1 C)   SpO2 96%   Wt Readings from Last 3 Encounters:  05/27/19 205 lb 12 oz (93.3 kg)  05/03/19 206 lb (93.4 kg)  04/05/19 206 lb 12.8 oz (93.8 kg)    Physical Exam Vitals signs  and nursing note reviewed.  Constitutional:      Appearance: Normal appearance.  HENT:     Head: Atraumatic.  Eyes:     Extraocular Movements: Extraocular movements intact.     Conjunctiva/sclera: Conjunctivae normal.  Neck:     Musculoskeletal: Normal range of motion and neck supple.  Cardiovascular:     Rate and Rhythm: Normal rate and regular rhythm.  Pulmonary:     Effort: Pulmonary effort is normal.     Breath sounds: Normal breath sounds.  Abdominal:     General: Bowel sounds are normal. There is no distension.     Palpations: Abdomen is soft. There is no mass.     Tenderness: There is no abdominal tenderness. There is no right CVA tenderness or left CVA tenderness.     Hernia: No hernia is present.     Comments: - murphy's sign  Musculoskeletal: Normal range of motion.        General: No tenderness (No right anterior, lateral or posterior rib ttp including to resistance exercises).  Skin:    General: Skin is warm and dry.  Neurological:     General: No focal deficit present.     Mental Status: He is oriented to person, place, and time.  Psychiatric:        Mood and Affect: Mood normal.  Thought Content: Thought content normal.        Judgment: Judgment normal.     Results for orders placed or performed in visit on 05/03/19  UA/M w/rflx Culture, Routine   Specimen: Urine   URINE  Result Value Ref Range   Specific Gravity, UA 1.010 1.005 - 1.030   pH, UA 7.0 5.0 - 7.5   Color, UA Yellow Yellow   Appearance Ur Clear Clear   Leukocytes,UA Negative Negative   Protein,UA Negative Negative/Trace   Glucose, UA Negative Negative   Ketones, UA Negative Negative   RBC, UA Negative Negative   Bilirubin, UA Negative Negative   Urobilinogen, Ur 0.2 0.2 - 1.0 mg/dL   Nitrite, UA Negative Negative      Assessment & Plan:   Problem List Items Addressed This Visit      Cardiovascular and Mediastinum   Essential hypertension    BPs elevated lately, pt does  endorse significant new stressors but declines anything to help with anxiety/stress control. Will increase usage of hydralazine prn to help better control BPs and monitor home readings closely. F/u as scheduled with Cardiology in a few weeks        Other   Palpitations - Primary   Shortness of breath    Following with Cardiology for his ongoing sxs of right sided chest pain, palpitations, SOB, etc. No improvement with nexium, and no obvious exam findings supporting possible abdominal or musculoskeletal etiology of pain and sxs today. Recommended he call Cardiology to discuss inability to afford cardiac testing at this time to see what his options are. Stress reduction reviewed, continued avoidance of caffeine or other substances, good BP control. F/u with Cardiology as scheduled for further workup.   Greater than 30 min spent today in direct care and counseling with patient  Follow up plan: Return for as scheduled.

## 2019-06-24 NOTE — Progress Notes (Deleted)
{Choose 1 Note Type (Telehealth Visit or Telephone Visit):[phone]}   I connected with  Clarence Ray on 06/24/19 by a video enabled telemedicine application and verified that I am speaking with the correct person using two identifiers. I discussed the limitations of evaluation and management by telemedicine. The patient expressed understanding and agreed to proceed.   Evaluation Performed:  Follow-up visit  Date:  06/24/2019   ID:  Clarence Ray, DOB February 11, 1976, MRN [id number]  Patient Location:  [place] [address]   Provider location:   [facility], [place] office  PCP:  Volney American, PA-C  Cardiologist:  [facility]   Chief Complaint:      History of Present Illness:    Clarence Ray is a 43 y.o. male who presents via audio/video conferencing for a telehealth visit today.   The patient does not symptoms concerning for COVID-19 infection (fever, chills, cough, or new SHORTNESS OF BREATH).   Patient has a past medical history of Clarence Ray is a 43 year old gentleman with past medical history of Hx of ETOH abuse with associated tachycardia/palpitations  smoking ( quit 6 months ago) in the past year Hypertension Palpitations F/u of  his palpitations/paroxysmal tachycardia  Reports that he has cut back on his alcohol Does have occasional social events where he may drink more Denies having significant tachycardia or palpitations on his current medication regimen Concerned about blood pressure running high Also very constipated  Doing well on cartia XT Did not fell well  On diltiazem generic  Changed diet, down to 200 pound Now back up  EKG personally reviewed by myself on todays visit Shows normal sinus rhythm with rate 83 bpm no significant ST or T wave changes  Previous records reviewed, tachycardia concerning for atrial tachycardia or SVT Previously felt to be triggered more by heavy alcohol but not  always On prior office visit reported drinking beer and hard alcohol On these occasions will develop at least 30 minutes of profound tachycardia with associated shortness of breath  Event monitor placed  2 Supraventricular Tachycardia runs occurred, the run with the fastest interval lasting 4 beats with a max rate of 174 bpm, the longest lasting 10 beats with an avg rate of 112 bpm.   Total cholesterol 194 LDL 127  Renal artery ultrasound with no stenosis  Long hx of palpitations, since childhood, Occasional skipped beats  Since September has started to have episodes of tachycardia  commonly associated when he drinks heavily  episodes are severe, described as a pounding, Rapid Last on Sunday (was drinking heavy on Sat and Sunday) rate 136, SOB >30 min, layed in bed  In September reported having severe episode of tachycardia  in the setting of ETOH Significant shortness of breath Arm tingling, emts came,  Tachycardia resolved by the time EMTs arrived    Prior CV studies:   The following studies were reviewed today:    Past Medical History:  Diagnosis Date  . Hyperlipidemia   . Hypertension   . SVT (supraventricular tachycardia) (HCC)    No past surgical history on file.    Allergies:   Atorvastatin and Losartan   Social History   Tobacco Use  . Smoking status: Former Smoker    Quit date: 06/19/2017    Years since quitting: 2.0  . Smokeless tobacco: Never Used  Substance Use Topics  . Alcohol use: Yes    Comment: on occasion  . Drug use: No  Current Outpatient Medications on File Prior to Visit  Medication Sig Dispense Refill  . albuterol (PROVENTIL HFA;VENTOLIN HFA) 108 (90 Base) MCG/ACT inhaler Inhale 2 puffs into the lungs every 6 (six) hours as needed for wheezing or shortness of breath. 1 Inhaler 2  . diltiazem (CARDIZEM CD) 180 MG 24 hr capsule Take 2 capsules (360 mg total) by mouth daily. BRAND NAME NECESSARY CARTIA XT 360 capsule 1  .  esomeprazole (NEXIUM) 20 MG capsule Take 1 capsule (20 mg total) by mouth daily. 90 capsule 2  . hydrALAZINE (APRESOLINE) 25 MG tablet Take 1 tablet (25 mg) by mouth three times a day as needed for a blood pressure > 140/90 90 tablet 3  . hydrochlorothiazide (HYDRODIURIL) 25 MG tablet Take 1 tablet (25 mg total) by mouth daily. 90 tablet 1   No current facility-administered medications on file prior to visit.      Family Hx: The patient's family history includes Anxiety disorder in his sister; Fibromyalgia in his sister and sister; Heart disease in his mother; Hypertension in his mother; Stroke in his paternal grandmother.  ROS:   Please see the history of present illness.    ROS    Labs/Other Tests and Data Reviewed:    Recent Labs: 01/25/2019: ALT 20; BUN 13; Creatinine, Ser 1.14; Hemoglobin 16.3; Platelets 243; Potassium 3.9; Sodium 143; TSH 1.070   Recent Lipid Panel Lab Results  Component Value Date/Time   CHOL 226 (H) 01/25/2019 09:20 AM   TRIG 94 01/25/2019 09:20 AM   HDL 36 (L) 01/25/2019 09:20 AM   LDLCALC 171 (H) 01/25/2019 09:20 AM    Wt Readings from Last 3 Encounters:  05/27/19 205 lb 12 oz (93.3 kg)  05/03/19 206 lb (93.4 kg)  04/05/19 206 lb 12.8 oz (93.8 kg)     Exam:    Vital Signs: Vital signs may also be detailed in the HPI There were no vitals taken for this visit.  Wt Readings from Last 3 Encounters:  05/27/19 205 lb 12 oz (93.3 kg)  05/03/19 206 lb (93.4 kg)  04/05/19 206 lb 12.8 oz (93.8 kg)   Temp Readings from Last 3 Encounters:  06/21/19 98.7 F (37.1 C)  05/27/19 (!) 97.2 F (36.2 C)  05/03/19 98.5 F (36.9 C) (Oral)   BP Readings from Last 3 Encounters:  06/21/19 (!) 157/101  05/27/19 (!) 142/100  05/03/19 135/89   Pulse Readings from Last 3 Encounters:  06/21/19 77  05/27/19 79  05/03/19 76     Well nourished, well developed male in no acute distress. Constitutional:  oriented to person, place, and time. No distress.  Head:  Normocephalic and atraumatic.  Eyes:  no discharge. No scleral icterus.  Neck: Normal range of motion. Neck supple.  Pulmonary/Chest: No audible wheezing, no distress, appears comfortable Musculoskeletal: Normal range of motion.  no  tenderness or deformity.  Neurological:   Coordination normal. Full exam not performed Skin:  No rash Psychiatric:  normal mood and affect. behavior is normal. Thought content normal.    ASSESSMENT & PLAN:    Problem List Items Addressed This Visit    None       COVID-19 Education: The signs and symptoms of COVID-19 were discussed with the patient and how to seek care for testing (follow up with PCP or arrange E-visit).  The importance of social distancing was discussed today.  Patient Risk:   After full review of this patients clinical status, I feel that they are at least moderate  risk at this time.  Time:   Today, I have spent 25 minutes with the patient with telehealth technology discussing the cardiac and medical problems/diagnoses detailed above   Additional 10 min spent reviewing the chart prior to patient visit today   Medication Adjustments/Labs and Tests Ordered: Current medicines are reviewed at length with the patient today.  Concerns regarding medicines are outlined above.   Tests Ordered: No tests ordered   Medication Changes: No changes made   Disposition: Follow-up in 12 months   Signed, Ida Rogue, MD  [place] [facility] [address], [place], [place] [address]

## 2019-06-26 ENCOUNTER — Telehealth: Payer: BLUE CROSS/BLUE SHIELD | Admitting: Cardiovascular Disease

## 2019-06-26 NOTE — Assessment & Plan Note (Signed)
BPs elevated lately, pt does endorse significant new stressors but declines anything to help with anxiety/stress control. Will increase usage of hydralazine prn to help better control BPs and monitor home readings closely. F/u as scheduled with Cardiology in a few weeks

## 2019-06-30 NOTE — Progress Notes (Signed)
Cardiology Office Note    Date:  07/02/2019   ID:  Clarence E Trost, DOB [birth year], MRN [id number]  PCP:  Volney American, PA-C  Cardiologist:  Ida Rogue, MD  Electrophysiologist:  None   Chief Complaint: Follow-up  History of Present Illness:   Clarence Ray is a 43 y.o. male with history of paroxysmal SVT, alcohol abuse, tobacco abuse, HTN with renal artery ultrasound in 09/2017 negative for RAS, and HLD with intolerance to Lipitor who presents for follow-up of  chest pain/palpitations/hypertension.  Patient was initially seen by Dr. Rockey Situ in 11/2017 for evaluation of hypertension and palpitations that dated back to 04/2017.  It appeared his palpitations were commonly associated with heavy alcohol use.  Zio monitor in 11/2017 showed NSR with an average heart rate of 84 bpm (minimum 48 bpm, maximum 174 bpm), 2 SVT runs occurred with the fastest interval lasting 4 beats with a maximum rate of 174 bpm and the longest interval lasting 10 beats with an average rate of 112 bpm.  SVT was detected with symptomatic patient events.  Isolated PACs and PVCs were noted.  Patient was started on generic diltiazem though did not feel well.  He seems to have tolerated Cardia XT well.    Over the past several visits the patient has continued to require titration of namebrand Cartia XT for elevated BP readings.  He was last seen in the office in late 04/2019 and reported intermittent chest pain that had been present for years though was worse over the past several months leading up to his visit.  Pain was both left-sided and right-sided.  He reported taking PPI and ibuprofen with some relief in his symptoms.  Symptoms were atypical and patient was advised to take PPI on a daily basis given he had noted improvement with this.  It was recommended he undergo [facility] though has not followed through with this.  Patient comes in with multiple complaints today and stating he just generally has not  felt well for the past 2 years.  He continues to note intermittent palpitations, randomly occurring chest discomfort and shortness of breath as well as intermittent fatigue.  He indicates the symptoms can happen both with exertion and at rest and have not been particularly associated with any activity.  Symptoms can range anywhere from several seconds to all day long.  BP has been running in the 676H systolic.  Compliant with long-acting diltiazem and HCTZ.  Currently without chest pain or shortness of breath.   Labs: 12/2018 -TSH normal, total cholesterol 226, triglyceride 94, HDL 36, LDL 171, serum creatinine 1.14, potassium 3.9, albumin 5.3, AST/ALT normal, Hgb 16.3, A1c 6.0  Past Medical History:  Diagnosis Date  . Hyperlipidemia   . Hypertension   . SVT (supraventricular tachycardia) ([place])     History reviewed. No pertinent surgical history.  Current Medications: No outpatient medications have been marked as taking for the 07/02/19 encounter (Office Visit) with Rise Mu, PA-C.    Allergies:   Atorvastatin and Losartan   Social History   Socioeconomic History  . Marital status: Married    Spouse name: Not on file  . Number of children: Not on file  . Years of education: Not on file  . Highest education level: Not on file  Occupational History  . Not on file  Social Needs  . Financial resource strain: Not on file  . Food insecurity    Worry: Not on file    Inability: Not  on file  . Transportation needs    Medical: Not on file    Non-medical: Not on file  Tobacco Use  . Smoking status: Former Smoker    Quit date: 06/19/2017    Years since quitting: 2.0  . Smokeless tobacco: Never Used  Substance and Sexual Activity  . Alcohol use: Yes    Comment: on occasion  . Drug use: No  . Sexual activity: Yes  Lifestyle  . Physical activity    Days per week: Not on file    Minutes per session: Not on file  . Stress: Not on file  Relationships  . Social Wellsite geologist on phone: Not on file    Gets together: Not on file    Attends religious service: Not on file    Active member of club or organization: Not on file    Attends meetings of clubs or organizations: Not on file    Relationship status: Not on file  Other Topics Concern  . Not on file  Social History Narrative  . Not on file     Family History:  The patient's family history includes Anxiety disorder in his sister; Fibromyalgia in his sister and sister; Heart disease in his mother; Hypertension in his mother; Stroke in his paternal grandmother.  ROS:   Review of Systems  Constitutional: Positive for malaise/fatigue. Negative for chills, diaphoresis, fever and weight loss.  HENT: Negative for congestion.   Eyes: Negative for discharge and redness.  Respiratory: Positive for shortness of breath. Negative for cough, hemoptysis, sputum production and wheezing.   Cardiovascular: Positive for chest pain. Negative for palpitations, orthopnea, claudication, leg swelling and PND.  Gastrointestinal: Negative for abdominal pain, blood in stool, heartburn, melena, nausea and vomiting.  Genitourinary: Negative for hematuria.  Musculoskeletal: Negative for falls and myalgias.  Skin: Negative for rash.  Neurological: Positive for headaches. Negative for dizziness, tingling, tremors, sensory change, speech change, focal weakness, loss of consciousness and weakness.  Endo/Heme/Allergies: Does not bruise/bleed easily.  Psychiatric/Behavioral: Negative for substance abuse. The patient is not nervous/anxious.   All other systems reviewed and are negative.    EKGs/Labs/Other Studies Reviewed:    Studies reviewed were summarized above. The additional studies were reviewed today: As above.   EKG:  EKG is ordered today.  The EKG ordered today demonstrates NSR, 73 bpm, LVH, nonspecific inferior and anterior ST-T changes, which are slightly more pronounced when compared to prior study  Recent Labs:  01/25/2019: ALT 20; BUN 13; Creatinine, Ser 1.14; Hemoglobin 16.3; Platelets 243; Potassium 3.9; Sodium 143; TSH 1.070  Recent Lipid Panel    Component Value Date/Time   CHOL 226 (H) 01/25/2019 0920   TRIG 94 01/25/2019 0920   HDL 36 (L) 01/25/2019 0920   LDLCALC 171 (H) 01/25/2019 0920    PHYSICAL EXAM:    VS:  BP 138/84 (BP Location: Left Arm, Patient Position: Sitting, Cuff Size: Normal)   Pulse 73   Ht 5\' 6"  (1.676 m)   Wt 208 lb 4 oz (94.5 kg)   SpO2 98%   BMI 33.61 kg/m   BMI: Body mass index is 33.61 kg/m.  Physical Exam  Wt Readings from Last 3 Encounters:  07/02/19 208 lb 4 oz (94.5 kg)  05/27/19 205 lb 12 oz (93.3 kg)  05/03/19 206 lb (93.4 kg)     ASSESSMENT & PLAN:   1. Hypertension: Blood pressure is reasonably controlled in the office today.  Continue Cardizem CD 360 mg daily  along with HCTZ 25 mg daily.  Patient advised to check blood pressure approximately 2 hours after taking medications moving forward.  Low-sodium diet.  Continue to recommend sleep study as outlined below.  2. Paroxysmal SVT: Continues to note intermittent palpitations.  May need to revisit outpatient cardiac monitoring to evaluate for ectopy/arrhythmia burden.  Remains on Cardizem CD 360 mg daily.  Intolerant to generic diltiazem.  3. Atypical chest pain/dyspnea: Symptoms are very atypical in presentation.  That said, he does have significant cardiac risk factors including hypertension, hyperlipidemia, obesity, and prior tobacco abuse.  Patient indicates he is unable to afford nuclear stress test at this time.  Given this, we will start with an echo to evaluate LV systolic function, wall motion, valvular function, and right-sided pressure.  If this is unrevealing, we could consider further risk stratification with a calcium score which he was told insurance does not cover and would be cash pay which we feel like his approximate $150 though this can be further discussed with the patient at time of  scheduling.  Following this, if he is found to have no significant coronary artery calcium no further ischemic work-up would be needed.  However, if he is found to have significant coronary artery calcium we may need to proceed with imaging such as a coronary CTA.  If echo demonstrates a new cardiomyopathy or wall motion abnormalities consistent with ischemia we may need to move directly to diagnostic cath.  Weight loss is advised.  4. Hyperlipidemia: LDL of 171 from 12/2018.  Consider calcium score for further risk ratification as outlined above.  5. Sleep disordered breathing: Previously referred for sleep study.  This was not direct discussed with the patient today and will be revisited when he is seen in follow-up.  Disposition: F/u with me in 1 month.   Medication Adjustments/Labs and Tests Ordered: Current medicines are reviewed at length with the patient today.  Concerns regarding medicines are outlined above. Medication changes, Labs and Tests ordered today are summarized above and listed in the Patient Instructions accessible in Encounters.   Signed, Eula Listenyan Jazira Maloney, PA-C 07/02/2019 10:49 AM     [facility] [address] [place], [place] [address] [phone]

## 2019-07-02 ENCOUNTER — Encounter: Payer: Self-pay | Admitting: Physician Assistant

## 2019-07-02 ENCOUNTER — Ambulatory Visit (INDEPENDENT_AMBULATORY_CARE_PROVIDER_SITE_OTHER): Payer: BLUE CROSS/BLUE SHIELD | Admitting: Physician Assistant

## 2019-07-02 ENCOUNTER — Other Ambulatory Visit: Payer: Self-pay

## 2019-07-02 VITALS — BP 138/84 | HR 73 | Ht 66.0 in | Wt 208.2 lb

## 2019-07-02 DIAGNOSIS — I471 Supraventricular tachycardia: Secondary | ICD-10-CM | POA: Diagnosis not present

## 2019-07-02 DIAGNOSIS — R0789 Other chest pain: Secondary | ICD-10-CM

## 2019-07-02 DIAGNOSIS — R0602 Shortness of breath: Secondary | ICD-10-CM

## 2019-07-02 DIAGNOSIS — G4730 Sleep apnea, unspecified: Secondary | ICD-10-CM

## 2019-07-02 DIAGNOSIS — I10 Essential (primary) hypertension: Secondary | ICD-10-CM

## 2019-07-02 DIAGNOSIS — E782 Mixed hyperlipidemia: Secondary | ICD-10-CM

## 2019-07-02 NOTE — Patient Instructions (Signed)
Medication Instructions:  Your physician recommends that you continue on your current medications as directed. Please refer to the Current Medication list given to you today.  *If you need a refill on your cardiac medications before your next appointment, please call your pharmacy*  Lab Work: None ordered  If you have labs (blood work) drawn today and your tests are completely normal, you will receive your results only by: . MyChart Message (if you have MyChart) OR . A paper copy in the mail If you have any lab test that is abnormal or we need to change your treatment, we will call you to review the results.  Testing/Procedures: 1- Echo  Please return to [facility] on ______________ at _______________ AM/PM for an Echocardiogram. Your physician has requested that you have an echocardiogram. Echocardiography is a painless test that uses sound waves to create images of your heart. It provides your doctor with information about the size and shape of your heart and how well your heart's chambers and valves are working. This procedure takes approximately one hour. There are no restrictions for this procedure. Please note; depending on visual quality an IV may need to be placed.    Follow-Up: At [facility], you and your health needs are our priority.  As part of our continuing mission to provide you with exceptional heart care, we have created designated Provider Care Teams.  These Care Teams include your primary Cardiologist (physician) and Advanced Practice Providers (APPs -  Physician Assistants and Nurse Practitioners) who all work together to provide you with the care you need, when you need it.  Your next appointment:   2 months  The format for your next appointment:   In Person  Provider:    You may see Timothy Gollan, MD or Ryan Dunn, PA-C.  

## 2019-07-03 ENCOUNTER — Telehealth: Payer: Self-pay | Admitting: Cardiovascular Disease

## 2019-07-03 NOTE — Telephone Encounter (Signed)
lmov to schedule echo and fu

## 2019-08-08 ENCOUNTER — Ambulatory Visit (INDEPENDENT_AMBULATORY_CARE_PROVIDER_SITE_OTHER): Payer: BLUE CROSS/BLUE SHIELD

## 2019-08-08 ENCOUNTER — Other Ambulatory Visit: Payer: Self-pay

## 2019-08-08 DIAGNOSIS — R0602 Shortness of breath: Secondary | ICD-10-CM | POA: Diagnosis not present

## 2019-08-12 ENCOUNTER — Telehealth: Payer: Self-pay

## 2019-08-12 NOTE — Telephone Encounter (Signed)
Call to patient to review results of echo. Pt verbalized understanding and all questions were answered.  Pt is due to for follow up in 1 month that has not been scheduled at this time. He wants to call back to make appt.   Palpitations, pressure in ears since Nov 1st. "can feel heart beating in ears and body".  Pt has BP readings and will send them in mychart.   It looks like he wore a monitor a year ago.  Advised pt to call for any further questions or concerns.  Will route to [employer], PA when I receive BP readings.

## 2019-08-12 NOTE — Telephone Encounter (Signed)
-----   Message from Rise Mu, PA-C sent at 08/11/2019  8:10 AM EST ----- Echo showed normal pump function with mildly thickened muscle and stiffened heart, mildly leaky mitral valve.   -Recommendations: -Continued optimal BP control recommended  -Mitral valve can be followed with periodic echo in the next 12-24 months

## 2019-08-13 DIAGNOSIS — R072 Precordial pain: Secondary | ICD-10-CM

## 2019-08-13 NOTE — Telephone Encounter (Signed)
mychart message sent to patient requesting BP numbers.  

## 2019-08-14 NOTE — Telephone Encounter (Signed)
See advice note for more detail.

## 2019-08-15 MED ORDER — DILTIAZEM HCL ER COATED BEADS 180 MG PO CP24: 360.0000 mg | ORAL_CAPSULE | Freq: Every day | ORAL | 3 refills | Status: DC

## 2019-08-15 MED ORDER — DILTIAZEM HCL ER COATED BEADS 360 MG PO CP24: 360.0000 mg | ORAL_CAPSULE | Freq: Every day | ORAL | 3 refills | Status: DC

## 2019-08-15 MED ORDER — HYDRALAZINE HCL 25 MG PO TABS: 25.0000 mg | ORAL_TABLET | Freq: Two times a day (BID) | ORAL | 3 refills | Status: DC

## 2019-08-15 NOTE — Telephone Encounter (Signed)
Call to patient to clarify current medications patient should be taking.   Recommended regimen:  Cartiz XT 360 mg daily ( or 180 mg bid, how ever he wants to take it)  Hydralazine 25 mg bid   Med list updated.   Pt verbalized understanding and will dec alcohol intake.   Since we have gone back and forth with questions and had some confusion, I suggested that he have a virtual appt with Christell Faith, PA.   I offered virtual with Christell Faith, PA tomorrow at 2 pm. Pt declined saying that he did not want to pay for visit and he felt like he understood current plan.  Pt said back plan to me and would like to go forward with CT and then have update.   Advised pt to call for any further questions or concerns.   Call to [facility] to confirm that brand Cardia XT is preferred.

## 2019-08-20 NOTE — Telephone Encounter (Signed)
Attempted to schedule no ans no vm  

## 2019-08-29 NOTE — Telephone Encounter (Signed)
Patient will call back another time

## 2019-09-09 ENCOUNTER — Other Ambulatory Visit: Payer: Self-pay | Admitting: Family Medicine

## 2019-10-24 NOTE — Telephone Encounter (Signed)
3 attempts to schedule fu appt from recall list.   Deleting recall.   

## 2019-12-06 ENCOUNTER — Telehealth: Payer: BLUE CROSS/BLUE SHIELD | Admitting: Family Medicine

## 2019-12-06 ENCOUNTER — Other Ambulatory Visit: Payer: Self-pay

## 2019-12-06 ENCOUNTER — Encounter: Payer: Self-pay | Admitting: Family Medicine

## 2019-12-06 VITALS — BP 130/75 | HR 71 | Temp 98.2°F | Wt 195.6 lb

## 2019-12-06 DIAGNOSIS — R3912 Poor urinary stream: Secondary | ICD-10-CM

## 2019-12-06 DIAGNOSIS — R350 Frequency of micturition: Secondary | ICD-10-CM | POA: Diagnosis not present

## 2019-12-06 DIAGNOSIS — I479 Paroxysmal tachycardia, unspecified: Secondary | ICD-10-CM

## 2019-12-06 DIAGNOSIS — I10 Essential (primary) hypertension: Secondary | ICD-10-CM

## 2019-12-06 LAB — UA/M W/RFLX CULTURE, ROUTINE
Bilirubin, UA: NEGATIVE
Glucose, UA: NEGATIVE
Ketones, UA: NEGATIVE
Leukocytes,UA: NEGATIVE
Nitrite, UA: NEGATIVE
Protein,UA: NEGATIVE
RBC, UA: NEGATIVE
Specific Gravity, UA: 1.02 (ref 1.005–1.030)
Urobilinogen, Ur: 0.2 mg/dL (ref 0.2–1.0)
pH, UA: 7 (ref 5.0–7.5)

## 2019-12-06 MED ORDER — TAMSULOSIN HCL 0.4 MG PO CAPS: 0.4000 mg | ORAL_CAPSULE | Freq: Every day | ORAL | 0 refills | Status: DC

## 2019-12-06 NOTE — Progress Notes (Signed)
BP 130/75   Pulse 71   Temp 98.2 F (36.8 C) (Oral)   Wt 195 lb 9.6 oz (88.7 kg)   SpO2 94%   BMI 31.57 kg/m    Subjective:    Patient ID: Clarence Ray, male    DOB: 1976/05/03, 44 y.o.   MRN: [id number]  HPI: Clarence Ray is a 44 y.o. male  Chief Complaint  Patient presents with  . Hypertension    . This visit was completed via MyChart due to the restrictions of the COVID-19 pandemic. All issues as above were discussed and addressed. Physical exam was done as above through visual confirmation on MyChart. If it was felt that the patient should be evaluated in the office, they were directed there. The patient verbally consented to this visit. . Location of the patient: in parked car . Location of the provider: home . Those involved with this call:  . Provider: Merrie Roof, PA-C . CMA: Lesle Chris, [place] . Front Desk/Registration: Jill Side  . Time spent on call: 25 minutes with patient face to face via video conference. More than 50% of this time was spent in counseling and coordination of care. 5 minutes total spent in review of patient's record and preparation of their chart. I verified patient identity using two factors (patient name and date of birth). Patient consents verbally to being seen via telemedicine visit today.   Patient presenting today for HTN follow up, but states he would prefer to discuss this issue in person due to some concerns he has been having. Also following with Cardiology due to his tachycardia issues.   Main concern today is several months now of poor, sometimes interrupted urine stream and urinary frequency. Denies dysuria, hematuria, prostate tenderness, hx of urinary issues or fhx of prostate cancer. Denies concern for STIs. Not trying anything OTC for sxs.   Relevant past medical, surgical, family and social history reviewed and updated as indicated. Interim medical history since our last visit reviewed. Allergies and medications reviewed  and updated.  Review of Systems  Per HPI unless specifically indicated above     Objective:    BP 130/75   Pulse 71   Temp 98.2 F (36.8 C) (Oral)   Wt 195 lb 9.6 oz (88.7 kg)   SpO2 94%   BMI 31.57 kg/m   Wt Readings from Last 3 Encounters:  12/06/19 195 lb 9.6 oz (88.7 kg)  07/02/19 208 lb 4 oz (94.5 kg)  05/27/19 205 lb 12 oz (93.3 kg)    Physical Exam Vitals and nursing note reviewed.  Constitutional:      General: He is not in acute distress.    Appearance: Normal appearance.  HENT:     Head: Atraumatic.     Right Ear: External ear normal.     Left Ear: External ear normal.     Nose: Nose normal. No congestion.     Mouth/Throat:     Mouth: Mucous membranes are moist.     Pharynx: Oropharynx is clear.  Eyes:     Extraocular Movements: Extraocular movements intact.     Conjunctiva/sclera: Conjunctivae normal.  Pulmonary:     Effort: Pulmonary effort is normal. No respiratory distress.  Genitourinary:    Comments: Unable to examine given virtual nature of visit Musculoskeletal:        General: Normal range of motion.     Cervical back: Normal range of motion.  Skin:    General: Skin is dry.  Findings: No erythema or rash.  Neurological:     Mental Status: He is oriented to person, place, and time.  Psychiatric:        Mood and Affect: Mood normal.        Thought Content: Thought content normal.        Judgment: Judgment normal.     Results for orders placed or performed in visit on 12/06/19  CBC with Differential/Platelet  Result Value Ref Range   WBC 4.9 3.4 - 10.8 x10E3/uL   RBC 5.41 4.14 - 5.80 x10E6/uL   Hemoglobin 16.6 13.0 - 17.7 g/dL   Hematocrit 56.3 87.5 - 51.0 %   MCV 87 79 - 97 fL   MCH 30.7 26.6 - 33.0 pg   MCHC 35.2 31.5 - 35.7 g/dL   RDW 64.3 32.9 - 51.8 %   Platelets 251 150 - 450 x10E3/uL   Neutrophils 47 Not Estab. %   Lymphs 37 Not Estab. %   Monocytes 13 Not Estab. %   Eos 2 Not Estab. %   Basos 1 Not Estab. %    Neutrophils Absolute 2.3 1.4 - 7.0 x10E3/uL   Lymphocytes Absolute 1.8 0.7 - 3.1 x10E3/uL   Monocytes Absolute 0.6 0.1 - 0.9 x10E3/uL   EOS (ABSOLUTE) 0.1 0.0 - 0.4 x10E3/uL   Basophils Absolute 0.0 0.0 - 0.2 x10E3/uL   Immature Granulocytes 0 Not Estab. %   Immature Grans (Abs) 0.0 0.0 - 0.1 x10E3/uL  PSA  Result Value Ref Range   Prostate Specific Ag, Serum 1.3 0.0 - 4.0 ng/mL  UA/M w/rflx Culture, Routine   Specimen: Urine   URINE  Result Value Ref Range   Specific Gravity, UA 1.020 1.005 - 1.030   pH, UA 7.0 5.0 - 7.5   Color, UA Yellow Yellow   Appearance Ur Clear Clear   Leukocytes,UA Negative Negative   Protein,UA Negative Negative/Trace   Glucose, UA Negative Negative   Ketones, UA Negative Negative   RBC, UA Negative Negative   Bilirubin, UA Negative Negative   Urobilinogen, Ur 0.2 0.2 - 1.0 mg/dL   Nitrite, UA Negative Negative  Comprehensive metabolic panel  Result Value Ref Range   Glucose 94 65 - 99 mg/dL   BUN 9 6 - 24 mg/dL   Creatinine, Ser 8.41 0.76 - 1.27 mg/dL   GFR calc non Af Amer 85 >59 mL/min/1.73   GFR calc Af Amer 98 >59 mL/min/1.73   BUN/Creatinine Ratio 8 (L) 9 - 20   Sodium 139 134 - 144 mmol/L   Potassium 3.7 3.5 - 5.2 mmol/L   Chloride 102 96 - 106 mmol/L   CO2 22 20 - 29 mmol/L   Calcium 9.6 8.7 - 10.2 mg/dL   Total Protein 7.6 6.0 - 8.5 g/dL   Albumin 4.7 4.0 - 5.0 g/dL   Globulin, Total 2.9 1.5 - 4.5 g/dL   Albumin/Globulin Ratio 1.6 1.2 - 2.2   Bilirubin Total 0.4 0.0 - 1.2 mg/dL   Alkaline Phosphatase 108 39 - 117 IU/L   AST 21 0 - 40 IU/L   ALT 14 0 - 44 IU/L      Assessment & Plan:   Problem List Items Addressed This Visit      Cardiovascular and Mediastinum   Essential hypertension - Primary    Unsure the nature of his concerns, but pt wanting to discuss in person soon. Will set up in person f/u to address these and continue current regimen in meantime  Relevant Orders   Comprehensive metabolic panel (Completed)    CBC with Differential/Platelet (Completed)   Paroxysmal tachycardia (HCC)    Following with Cardiology for this, continue per their recommendations       Other Visit Diagnoses    Urinary frequency       Will obtain U/A, PSA and CMP for further evaluation of his new urinary issues.    Relevant Orders   UA/M w/rflx Culture, Routine (Completed)   Poor urinary stream       Obtain labs, trial flomax in case due to BPH. If labs normal and no improvement refer to Urology for further evaluation.    Relevant Orders   PSA (Completed)       Follow up plan: Return for in person HTN eval - patient requested .

## 2019-12-07 LAB — CBC WITH DIFFERENTIAL/PLATELET
Basophils Absolute: 0 10*3/uL (ref 0.0–0.2)
Basos: 1 %
EOS (ABSOLUTE): 0.1 10*3/uL (ref 0.0–0.4)
Eos: 2 %
Hematocrit: 47.1 % (ref 37.5–51.0)
Hemoglobin: 16.6 g/dL (ref 13.0–17.7)
Immature Grans (Abs): 0 10*3/uL (ref 0.0–0.1)
Immature Granulocytes: 0 %
Lymphocytes Absolute: 1.8 10*3/uL (ref 0.7–3.1)
Lymphs: 37 %
MCH: 30.7 pg (ref 26.6–33.0)
MCHC: 35.2 g/dL (ref 31.5–35.7)
MCV: 87 fL (ref 79–97)
Monocytes Absolute: 0.6 10*3/uL (ref 0.1–0.9)
Monocytes: 13 %
Neutrophils Absolute: 2.3 10*3/uL (ref 1.4–7.0)
Neutrophils: 47 %
Platelets: 251 10*3/uL (ref 150–450)
RBC: 5.41 x10E6/uL (ref 4.14–5.80)
RDW: 13.9 % (ref 11.6–15.4)
WBC: 4.9 10*3/uL (ref 3.4–10.8)

## 2019-12-07 LAB — COMPREHENSIVE METABOLIC PANEL
ALT: 14 IU/L (ref 0–44)
AST: 21 IU/L (ref 0–40)
Albumin/Globulin Ratio: 1.6 (ref 1.2–2.2)
Albumin: 4.7 g/dL (ref 4.0–5.0)
Alkaline Phosphatase: 108 IU/L (ref 39–117)
BUN/Creatinine Ratio: 8 — ABNORMAL LOW (ref 9–20)
BUN: 9 mg/dL (ref 6–24)
Bilirubin Total: 0.4 mg/dL (ref 0.0–1.2)
CO2: 22 mmol/L (ref 20–29)
Calcium: 9.6 mg/dL (ref 8.7–10.2)
Chloride: 102 mmol/L (ref 96–106)
Creatinine, Ser: 1.07 mg/dL (ref 0.76–1.27)
GFR calc Af Amer: 98 mL/min/{1.73_m2} (ref >59)
GFR calc non Af Amer: 85 mL/min/{1.73_m2} (ref >59)
Globulin, Total: 2.9 g/dL (ref 1.5–4.5)
Glucose: 94 mg/dL (ref 65–99)
Potassium: 3.7 mmol/L (ref 3.5–5.2)
Sodium: 139 mmol/L (ref 134–144)
Total Protein: 7.6 g/dL (ref 6.0–8.5)

## 2019-12-07 LAB — PSA: Prostate Specific Ag, Serum: 1.3 ng/mL (ref 0.0–4.0)

## 2019-12-12 NOTE — Assessment & Plan Note (Signed)
Following with Cardiology for this, continue per their recommendations

## 2019-12-12 NOTE — Assessment & Plan Note (Signed)
Unsure the nature of his concerns, but pt wanting to discuss in person soon. Will set up in person f/u to address these and continue current regimen in meantime

## 2019-12-16 ENCOUNTER — Telehealth: Payer: Self-pay | Admitting: Family Medicine

## 2019-12-16 NOTE — Telephone Encounter (Signed)
Called pt to schedule, no answer, left vm 

## 2019-12-16 NOTE — Telephone Encounter (Signed)
-----   Message from Particia Nearing, [place] sent at 12/12/2019  4:45 AM EDT ----- Pt requesting in person OV for HTN, I believe wanting that to be soon

## 2019-12-24 NOTE — Telephone Encounter (Signed)
Called pt again, no answer, left vm

## 2020-01-01 ENCOUNTER — Other Ambulatory Visit: Payer: Self-pay

## 2020-01-01 ENCOUNTER — Encounter: Payer: Self-pay | Admitting: Family Medicine

## 2020-01-01 ENCOUNTER — Ambulatory Visit (INDEPENDENT_AMBULATORY_CARE_PROVIDER_SITE_OTHER): Payer: BLUE CROSS/BLUE SHIELD | Admitting: Family Medicine

## 2020-01-01 VITALS — BP 127/76 | HR 74 | Temp 98.5°F | Wt 192.0 lb

## 2020-01-01 DIAGNOSIS — I479 Paroxysmal tachycardia, unspecified: Secondary | ICD-10-CM | POA: Diagnosis not present

## 2020-01-01 DIAGNOSIS — R39198 Other difficulties with micturition: Secondary | ICD-10-CM

## 2020-01-01 MED ORDER — METOPROLOL SUCCINATE ER 50 MG PO TB24: 50.0000 mg | ORAL_TABLET | Freq: Every day | ORAL | 0 refills | Status: DC

## 2020-01-01 NOTE — Assessment & Plan Note (Signed)
BP and HRs currently well controlled, but pt very symtomatic and feels this is all related to the diltiazem. Will d/c and start metoprolol, monitor closely for benefit. F/u in 2 weeks for recheck. Unclear how many if any of his sxs are related to medication but will watch for improvement once off

## 2020-01-01 NOTE — Progress Notes (Signed)
BP 127/76   Pulse 74   Temp 98.5 F (36.9 C)   Wt 192 lb (87.1 kg) Comment: Patient reported  SpO2 96%   BMI 30.99 kg/m    Subjective:    Patient ID: Clarence Ray, male    DOB: 1976-05-15, 44 y.o.   MRN: [id number]  HPI: Clarence Ray is a 44 y.o. male  Chief Complaint  Patient presents with  . Hypertension  . Urinary Frequency   Prostate fullness, urine stream issues ongoing about 6 months. Dysuria at times and lower abdominal pain. Had normal U/A and PSA last month. Stopped the flomax because it made him shaky, lightheaded and nauseated despite normal BPs. Denies fever, chills, gross hematuria, N/V. No fhx of prostate issues.  About a month after starting diltizaem about a year ago started noticing worsened headaches, constipation, short term memory loss, frequent sinus congestion, random palpitations and SOB that are ongoing and intermittent. Losartan was the initial BP medication but that caused 20 lb weight gain. Was then put on amlodipine but that seemed to start random palpitations. Not happy with how he's feeling overall. Has been seen by Cardiology for these issues in the past, due for f/u with them. Denies current CP, SOB, palpitations, syncope. Taking medicines faithfully and trying to eat well and exercise.   Relevant past medical, surgical, family and social history reviewed and updated as indicated. Interim medical history since our last visit reviewed. Allergies and medications reviewed and updated.  Review of Systems  Per HPI unless specifically indicated above     Objective:    BP 127/76   Pulse 74   Temp 98.5 F (36.9 C)   Wt 192 lb (87.1 kg) Comment: Patient reported  SpO2 96%   BMI 30.99 kg/m   Wt Readings from Last 3 Encounters:  01/01/20 192 lb (87.1 kg)  12/06/19 195 lb 9.6 oz (88.7 kg)  07/02/19 208 lb 4 oz (94.5 kg)    Physical Exam Vitals and nursing note reviewed.  Constitutional:      Appearance: Normal appearance.  HENT:   Head: Atraumatic.  Eyes:     Extraocular Movements: Extraocular movements intact.     Conjunctiva/sclera: Conjunctivae normal.  Cardiovascular:     Rate and Rhythm: Normal rate and regular rhythm.  Pulmonary:     Effort: Pulmonary effort is normal.     Breath sounds: Normal breath sounds.  Abdominal:     General: Bowel sounds are normal. There is no distension.     Palpations: Abdomen is soft.     Tenderness: There is no abdominal tenderness. There is no right CVA tenderness, left CVA tenderness or guarding.  Genitourinary:    Comments: Declines GU exam, will defer to Urology Musculoskeletal:        General: Normal range of motion.     Cervical back: Normal range of motion and neck supple.  Skin:    General: Skin is warm and dry.  Neurological:     General: No focal deficit present.     Mental Status: He is oriented to person, place, and time.  Psychiatric:        Mood and Affect: Mood normal.        Thought Content: Thought content normal.        Judgment: Judgment normal.     Results for orders placed or performed in visit on 12/06/19  CBC with Differential/Platelet  Result Value Ref Range   WBC 4.9 3.4 - 10.8 x10E3/uL  RBC 5.41 4.14 - 5.80 x10E6/uL   Hemoglobin 16.6 13.0 - 17.7 g/dL   Hematocrit 44.9 67.5 - 51.0 %   MCV 87 79 - 97 fL   MCH 30.7 26.6 - 33.0 pg   MCHC 35.2 31.5 - 35.7 g/dL   RDW 91.6 38.4 - 66.5 %   Platelets 251 150 - 450 x10E3/uL   Neutrophils 47 Not Estab. %   Lymphs 37 Not Estab. %   Monocytes 13 Not Estab. %   Eos 2 Not Estab. %   Basos 1 Not Estab. %   Neutrophils Absolute 2.3 1.4 - 7.0 x10E3/uL   Lymphocytes Absolute 1.8 0.7 - 3.1 x10E3/uL   Monocytes Absolute 0.6 0.1 - 0.9 x10E3/uL   EOS (ABSOLUTE) 0.1 0.0 - 0.4 x10E3/uL   Basophils Absolute 0.0 0.0 - 0.2 x10E3/uL   Immature Granulocytes 0 Not Estab. %   Immature Grans (Abs) 0.0 0.0 - 0.1 x10E3/uL  PSA  Result Value Ref Range   Prostate Specific Ag, Serum 1.3 0.0 - 4.0 ng/mL  UA/M  w/rflx Culture, Routine   Specimen: Urine   URINE  Result Value Ref Range   Specific Gravity, UA 1.020 1.005 - 1.030   pH, UA 7.0 5.0 - 7.5   Color, UA Yellow Yellow   Appearance Ur Clear Clear   Leukocytes,UA Negative Negative   Protein,UA Negative Negative/Trace   Glucose, UA Negative Negative   Ketones, UA Negative Negative   RBC, UA Negative Negative   Bilirubin, UA Negative Negative   Urobilinogen, Ur 0.2 0.2 - 1.0 mg/dL   Nitrite, UA Negative Negative  Comprehensive metabolic panel  Result Value Ref Range   Glucose 94 65 - 99 mg/dL   BUN 9 6 - 24 mg/dL   Creatinine, Ser 9.93 0.76 - 1.27 mg/dL   GFR calc non Af Amer 85 >59 mL/min/1.73   GFR calc Af Amer 98 >59 mL/min/1.73   BUN/Creatinine Ratio 8 (L) 9 - 20   Sodium 139 134 - 144 mmol/L   Potassium 3.7 3.5 - 5.2 mmol/L   Chloride 102 96 - 106 mmol/L   CO2 22 20 - 29 mmol/L   Calcium 9.6 8.7 - 10.2 mg/dL   Total Protein 7.6 6.0 - 8.5 g/dL   Albumin 4.7 4.0 - 5.0 g/dL   Globulin, Total 2.9 1.5 - 4.5 g/dL   Albumin/Globulin Ratio 1.6 1.2 - 2.2   Bilirubin Total 0.4 0.0 - 1.2 mg/dL   Alkaline Phosphatase 108 39 - 117 IU/L   AST 21 0 - 40 IU/L   ALT 14 0 - 44 IU/L      Assessment & Plan:   Problem List Items Addressed This Visit      Cardiovascular and Mediastinum   Paroxysmal tachycardia (HCC)    BP and HRs currently well controlled, but pt very symtomatic and feels this is all related to the diltiazem. Will d/c and start metoprolol, monitor closely for benefit. F/u in 2 weeks for recheck. Unclear how many if any of his sxs are related to medication but will watch for improvement once off      Relevant Medications   metoprolol succinate (TOPROL-XL) 50 MG 24 hr tablet    Other Visit Diagnoses    Decreased urine stream    -  Primary   Concerning persistent sxs, will refer to Urology for further workup. Flomax added to intolerance list d/t side effects   Relevant Orders   Ambulatory referral to Urology  Greater than 25 minutes spent today in direct patient care and counseling  Follow up plan: Return in about 2 weeks (around 01/15/2020) for HTN, HR recheck.

## 2020-01-03 ENCOUNTER — Other Ambulatory Visit: Payer: Self-pay | Admitting: Family Medicine

## 2020-01-06 ENCOUNTER — Encounter: Payer: Self-pay | Admitting: Family Medicine

## 2020-01-09 ENCOUNTER — Encounter: Payer: Self-pay | Admitting: Family Medicine

## 2020-01-10 ENCOUNTER — Other Ambulatory Visit: Payer: Self-pay | Admitting: Family Medicine

## 2020-01-16 ENCOUNTER — Ambulatory Visit (INDEPENDENT_AMBULATORY_CARE_PROVIDER_SITE_OTHER): Payer: BLUE CROSS/BLUE SHIELD | Admitting: Urology

## 2020-01-16 ENCOUNTER — Encounter: Payer: Self-pay | Admitting: Urology

## 2020-01-16 ENCOUNTER — Other Ambulatory Visit: Payer: Self-pay

## 2020-01-16 VITALS — BP 143/83 | HR 73 | Ht 66.0 in | Wt 197.0 lb

## 2020-01-16 DIAGNOSIS — R350 Frequency of micturition: Secondary | ICD-10-CM

## 2020-01-16 DIAGNOSIS — R39198 Other difficulties with micturition: Secondary | ICD-10-CM

## 2020-01-16 LAB — BLADDER SCAN AMB NON-IMAGING: Scan Result: 85

## 2020-01-16 MED ORDER — SENNOSIDES-DOCUSATE SODIUM 8.6-50 MG PO TABS
1.0000 | ORAL_TABLET | Freq: Two times a day (BID) | ORAL | 1 refills | Status: AC
Start: 2020-01-16 — End: ?

## 2020-01-16 NOTE — Progress Notes (Signed)
01/16/20 3:41 PM   Clarence Ray Mar 11, 1976 [id number]  CC: Urinary symptoms  HPI: I saw Clarence Ray in urology clinic today for a number of urologic issues.  He is a 44 year old male with medical history notable for hypertension on hydrochlorothiazide who presents with urinary frequency, intermittency, weak stream, feeling of incomplete emptying, nocturia 1-2 times per night.  He is very bothered by his symptoms.  He was trialed on Flomax by his PCP with no improvement in his urination, and he had significant side effects of feeling dizzy and no energy from the Flomax.  He does have severe constipation at baseline and only has 1-2 bowel movements a week.  He has a family history of fibromyalgia.  He also describes some "prostate swelling "and "prostate pressure."  He denies any prior UTIs or episodes of urinary retention.  He thinks he may have had some perineal trauma as a kid with bike riding but nothing recently.  His symptoms have been present for 1 to 2 years and seem to have worsened over the last 6 to 12 months.  He has a family history of prostate cancer in an uncle, but he was unsure if this was lethal.  He had an STD when he was 44 years old, but denies any recent STDs.  He drinks only water during the day.  IPSS score today 23, with quality of life unhappy.  Urinalysis is completely benign today with 0 RBCs, 0 WBCs, no bacteria, nitrite negative.  PVR is normal at 85 mL.  PSA with primary was normal at 1.3   PMH: Past Medical History:  Diagnosis Date  . Hyperlipidemia   . Hypertension   . SVT (supraventricular tachycardia) (HCC)     Family History: Family History  Problem Relation Age of Onset  . Heart disease Mother   . Hypertension Mother   . Fibromyalgia Sister   . Anxiety disorder Sister   . Stroke Paternal Grandmother   . Fibromyalgia Sister     Social History:  reports that he quit smoking about 2 years ago. He has never used smokeless tobacco. He reports  current alcohol use. He reports that he does not use drugs.  Physical Exam: BP (!) 143/83   Pulse 73   Ht 5\' 6"  (1.676 m)   Wt 197 lb (89.4 kg)   BMI 31.80 kg/m    Constitutional:  Alert and oriented, No acute distress. Cardiovascular: No clubbing, cyanosis, or edema. Respiratory: Normal respiratory effort, no increased work of breathing. GI: Abdomen is soft, nontender, nondistended, no abdominal masses GU: widely patent meatus, testicles 20 cc and descended bilaterally DRE: 20 g, smooth, no nodules or masses  Laboratory Data: Reviewed, see HPI  Pertinent Imaging: No prior cross-sectional imaging to review  Assessment & Plan:   In summary, is a 44 year old male with a 1 to 2-year history of worsening urinary symptoms including frequency, weak stream, feeling of incomplete emptying, and pelvic discomfort.  Lab work and physical exam are benign.  We had long conversation about possible etiologies including overactive bladder, chronic prostatitis, urethral stricture disease, and constipation contributing to his urinary symptoms.  I do think that constipation is playing a large role in his urinary symptoms, I recommended starting by addressing this more aggressively.  I also recommended a cystoscopy if symptoms do not improve to rule out urethral stricture disease.  If addressing his constipation does not improve his symptoms and cystoscopy is normal, he may benefit from pelvic floor physical therapy or  even anti-inflammatories.  Senna docusate/MiraLAX for constipation RTC 4 to 6 weeks for cystoscopy, okay to cancel if symptoms resolved with treating constipation Consider pelvic floor physical therapy or Myrbetriq in the future  I spent 45 total minutes on the day of the encounter including pre-visit review of the medical record, face-to-face time with the patient, and post visit ordering of labs/imaging/tests.  Legrand Rams, MD 01/16/2020  [facility] [address], [address] [place], [place] [address] [phone]

## 2020-01-16 NOTE — Patient Instructions (Addendum)
1. Start Miralax to help with constipation, goal is one bowel movement per day  Constipation, Adult Constipation is when a person has fewer bowel movements in a week than normal, has difficulty having a bowel movement, or has stools that are dry, hard, or larger than normal. Constipation may be caused by an underlying condition. It may become worse with age if a person takes certain medicines and does not take in enough fluids. Follow these instructions at home: Eating and drinking   Eat foods that have a lot of fiber, such as fresh fruits and vegetables, whole grains, and beans.  Limit foods that are high in fat, low in fiber, or overly processed, such as french fries, hamburgers, cookies, candies, and soda.  Drink enough fluid to keep your urine clear or pale yellow. General instructions  Exercise regularly or as told by your health care provider.  Go to the restroom when you have the urge to go. Do not hold it in.  Take over-the-counter and prescription medicines only as told by your health care provider. These include any fiber supplements.  Practice pelvic floor retraining exercises, such as deep breathing while relaxing the lower abdomen and pelvic floor relaxation during bowel movements.  Watch your condition for any changes.  Keep all follow-up visits as told by your health care provider. This is important. Contact a health care provider if:  You have pain that gets worse.  You have a fever.  You do not have a bowel movement after 4 days.  You vomit.  You are not hungry.  You lose weight.  You are bleeding from the anus.  You have thin, pencil-like stools. Get help right away if:  You have a fever and your symptoms suddenly get worse.  You leak stool or have blood in your stool.  Your abdomen is bloated.  You have severe pain in your abdomen.  You feel dizzy or you faint. This information is not intended to replace advice given to you by your health care  provider. Make sure you discuss any questions you have with your health care provider. Document Revised: 07/28/2017 Document Reviewed: 02/03/2016 Elsevier Patient Education  2020 [employer].   Cystoscopy Cystoscopy is a procedure that is used to help diagnose and sometimes treat conditions that affect the lower urinary tract. The lower urinary tract includes the bladder and the urethra. The urethra is the tube that drains urine from the bladder. Cystoscopy is done using a thin, tube-shaped instrument with a light and camera at the end (cystoscope). The cystoscope may be hard or flexible, depending on the goal of the procedure. The cystoscope is inserted through the urethra, into the bladder. Cystoscopy may be recommended if you have:  Urinary tract infections that keep coming back.  Blood in the urine (hematuria).  An inability to control when you urinate (urinary incontinence) or an overactive bladder.  Unusual cells found in a urine sample.  A blockage in the urethra, such as a urinary stone.  Painful urination.  An abnormality in the bladder found during an intravenous pyelogram (IVP) or CT scan. Cystoscopy may also be done to remove a sample of tissue to be examined under a microscope (biopsy). What are the risks? Generally, this is a safe procedure. However, problems may occur, including:  Infection.  Bleeding.  What happens during the procedure?  1. You will be given one or more of the following: ? A medicine to numb the area (local anesthetic). 2. The area around the  opening of your urethra will be cleaned. 3. The cystoscope will be passed through your urethra into your bladder. 4. Germ-free (sterile) fluid will flow through the cystoscope to fill your bladder. The fluid will stretch your bladder so that your health care provider can clearly examine your bladder walls. 5. Your doctor will look at the urethra and bladder. 6. The cystoscope will be removed The procedure  may vary among health care providers  What can I expect after the procedure? After the procedure, it is common to have: 1. Some soreness or pain in your abdomen and urethra. 2. Urinary symptoms. These include: ? Mild pain or burning when you urinate. Pain should stop within a few minutes after you urinate. This may last for up to 1 week. ? A small amount of blood in your urine for several days. ? Feeling like you need to urinate but producing only a small amount of urine. Follow these instructions at home: General instructions  Return to your normal activities as told by your health care provider.   Do not drive for 24 hours if you were given a sedative during your procedure.  Watch for any blood in your urine. If the amount of blood in your urine increases, call your health care provider.  If a tissue sample was removed for testing (biopsy) during your procedure, it is up to you to get your test results. Ask your health care provider, or the department that is doing the test, when your results will be ready.  Drink enough fluid to keep your urine pale yellow.  Keep all follow-up visits as told by your health care provider. This is important. Contact a health care provider if you:  Have pain that gets worse or does not get better with medicine, especially pain when you urinate.  Have trouble urinating.  Have more blood in your urine. Get help right away if you:  Have blood clots in your urine.  Have abdominal pain.  Have a fever or chills.  Are unable to urinate. Summary  Cystoscopy is a procedure that is used to help diagnose and sometimes treat conditions that affect the lower urinary tract.  Cystoscopy is done using a thin, tube-shaped instrument with a light and camera at the end.  After the procedure, it is common to have some soreness or pain in your abdomen and urethra.  Watch for any blood in your urine. If the amount of blood in your urine increases, call your  health care provider.  This information is not intended to replace advice given to you by your health care provider. Make sure you discuss any questions you have with your health care provider. Document Revised: 08/07/2018 Document Reviewed: 08/07/2018 Elsevier Patient Education  2020 [employer].

## 2020-01-17 ENCOUNTER — Ambulatory Visit: Payer: BLUE CROSS/BLUE SHIELD | Admitting: Family Medicine

## 2020-01-17 ENCOUNTER — Encounter: Payer: Self-pay | Admitting: Family Medicine

## 2020-01-17 ENCOUNTER — Other Ambulatory Visit: Payer: Self-pay

## 2020-01-17 VITALS — BP 143/83 | HR 71 | Temp 98.2°F | Wt 198.0 lb

## 2020-01-17 DIAGNOSIS — I10 Essential (primary) hypertension: Secondary | ICD-10-CM

## 2020-01-17 DIAGNOSIS — I479 Paroxysmal tachycardia, unspecified: Secondary | ICD-10-CM | POA: Diagnosis not present

## 2020-01-17 LAB — MICROSCOPIC EXAMINATION
Bacteria, UA: NONE SEEN
WBC, UA: NONE SEEN /hpf (ref 0–5)

## 2020-01-17 LAB — URINALYSIS, COMPLETE
Bilirubin, UA: NEGATIVE
Glucose, UA: NEGATIVE
Ketones, UA: NEGATIVE
Leukocytes,UA: NEGATIVE
Nitrite, UA: NEGATIVE
Protein,UA: NEGATIVE
RBC, UA: NEGATIVE
Specific Gravity, UA: 1.015 (ref 1.005–1.030)
Urobilinogen, Ur: 0.2 mg/dL (ref 0.2–1.0)
pH, UA: 7 (ref 5.0–7.5)

## 2020-01-17 MED ORDER — METOPROLOL SUCCINATE ER 50 MG PO TB24: 50.0000 mg | ORAL_TABLET | Freq: Every day | ORAL | 1 refills | Status: DC

## 2020-01-17 MED ORDER — HYDRALAZINE HCL 50 MG PO TABS: 50.0000 mg | ORAL_TABLET | Freq: Two times a day (BID) | ORAL | 1 refills | Status: DC | PRN

## 2020-01-17 NOTE — Progress Notes (Signed)
BP (!) 143/83   Pulse 71   Temp 98.2 F (36.8 C) (Oral)   Wt 198 lb (89.8 kg)   SpO2 96%   BMI 31.96 kg/m    Subjective:    Patient ID: Clarence Ray, male    DOB: 31-Dec-1975, 44 y.o.   MRN: [id number]  HPI: Clarence Ray is a 44 y.o. male  Chief Complaint  Patient presents with  . Hypertension   Here today for HTN f/u after d/z of diltiazem and starting metoprolol. Home BPs running 120s/70s - 130s/80s. Taking the hydralazine 50 mg in the morning and sometimes also in the evening if needed. Typically only taking 25 mg when having to take that evening dose. Still occasional headache here and there but overall feeling well. Feeling better overall off the diltizem - no longer having as many palpitations and notes his other side effects he was concerned about have also dissipated. Denies CP, SOB, dizziness, syncope. Following with Cardiology for this also.   Relevant past medical, surgical, family and social history reviewed and updated as indicated. Interim medical history since our last visit reviewed. Allergies and medications reviewed and updated.  Review of Systems  Per HPI unless specifically indicated above     Objective:    BP (!) 143/83   Pulse 71   Temp 98.2 F (36.8 C) (Oral)   Wt 198 lb (89.8 kg)   SpO2 96%   BMI 31.96 kg/m   Wt Readings from Last 3 Encounters:  01/17/20 198 lb (89.8 kg)  01/16/20 197 lb (89.4 kg)  01/01/20 192 lb (87.1 kg)    Physical Exam Vitals and nursing note reviewed.  Constitutional:      Appearance: Normal appearance.  HENT:     Head: Atraumatic.  Eyes:     Extraocular Movements: Extraocular movements intact.     Conjunctiva/sclera: Conjunctivae normal.  Cardiovascular:     Rate and Rhythm: Normal rate and regular rhythm.  Pulmonary:     Effort: Pulmonary effort is normal.     Breath sounds: Normal breath sounds.  Musculoskeletal:        General: Normal range of motion.     Cervical back: Normal range of motion and  neck supple.  Skin:    General: Skin is warm and dry.  Neurological:     General: No focal deficit present.     Mental Status: He is oriented to person, place, and time.  Psychiatric:        Mood and Affect: Mood normal.        Thought Content: Thought content normal.        Judgment: Judgment normal.     Results for orders placed or performed in visit on 01/16/20  Microscopic Examination   URINE  Result Value Ref Range   WBC, UA None seen 0 - 5 /hpf   RBC 0-2 0 - 2 /hpf   Epithelial Cells (non renal) 0-10 0 - 10 /hpf   Bacteria, UA None seen None seen/Few  Urinalysis, Complete  Result Value Ref Range   Specific Gravity, UA 1.015 1.005 - 1.030   pH, UA 7.0 5.0 - 7.5   Color, UA Yellow Yellow   Appearance Ur Clear Clear   Leukocytes,UA Negative Negative   Protein,UA Negative Negative/Trace   Glucose, UA Negative Negative   Ketones, UA Negative Negative   RBC, UA Negative Negative   Bilirubin, UA Negative Negative   Urobilinogen, Ur 0.2 0.2 - 1.0 mg/dL  Nitrite, UA Negative Negative   Microscopic Examination See below:   BLADDER SCAN AMB NON-IMAGING  Result Value Ref Range   Scan Result 85 ml       Assessment & Plan:   Problem List Items Addressed This Visit      Cardiovascular and Mediastinum   Essential hypertension - Primary    Stable and well controlled on current regimen, with decreased side effects on the metoprolol vs diltiazem. Continue current regimen with close monitoring      Relevant Medications   hydrALAZINE (APRESOLINE) 50 MG tablet   metoprolol succinate (TOPROL-XL) 50 MG 24 hr tablet   Paroxysmal tachycardia (HCC)    Side effects with diltiazem, tolerating metoprolol better. Continue current regimen with close monitoring      Relevant Medications   hydrALAZINE (APRESOLINE) 50 MG tablet   metoprolol succinate (TOPROL-XL) 50 MG 24 hr tablet       Follow up plan: Return in about 6 months (around 07/19/2020) for 6 month f/u.

## 2020-01-19 NOTE — Assessment & Plan Note (Signed)
Side effects with diltiazem, tolerating metoprolol better. Continue current regimen with close monitoring

## 2020-01-19 NOTE — Assessment & Plan Note (Signed)
Stable and well controlled on current regimen, with decreased side effects on the metoprolol vs diltiazem. Continue current regimen with close monitoring

## 2020-01-29 ENCOUNTER — Encounter: Payer: Self-pay | Admitting: Family Medicine

## 2020-01-29 ENCOUNTER — Other Ambulatory Visit: Payer: Self-pay

## 2020-01-29 ENCOUNTER — Inpatient Hospital Stay
Admission: EM | Admit: 2020-01-29 | Discharge: 2020-01-31 | DRG: 287 | Disposition: A | Discharge status: Home / Self Care | Payer: BLUE CROSS/BLUE SHIELD | Attending: Internal Medicine | Admitting: Internal Medicine

## 2020-01-29 DIAGNOSIS — K219 Gastro-esophageal reflux disease without esophagitis: Secondary | ICD-10-CM | POA: Diagnosis present

## 2020-01-29 DIAGNOSIS — K581 Irritable bowel syndrome with constipation: Secondary | ICD-10-CM | POA: Diagnosis present

## 2020-01-29 DIAGNOSIS — E782 Mixed hyperlipidemia: Secondary | ICD-10-CM | POA: Diagnosis not present

## 2020-01-29 DIAGNOSIS — I471 Supraventricular tachycardia: Secondary | ICD-10-CM | POA: Diagnosis not present

## 2020-01-29 DIAGNOSIS — R079 Chest pain, unspecified: Secondary | ICD-10-CM | POA: Diagnosis not present

## 2020-01-29 DIAGNOSIS — I10 Essential (primary) hypertension: Secondary | ICD-10-CM | POA: Diagnosis not present

## 2020-01-29 DIAGNOSIS — Z79899 Other long term (current) drug therapy: Secondary | ICD-10-CM

## 2020-01-29 DIAGNOSIS — Z20822 Contact with and (suspected) exposure to covid-19: Secondary | ICD-10-CM | POA: Diagnosis not present

## 2020-01-29 DIAGNOSIS — J45909 Unspecified asthma, uncomplicated: Secondary | ICD-10-CM | POA: Diagnosis not present

## 2020-01-29 DIAGNOSIS — R0789 Other chest pain: Secondary | ICD-10-CM

## 2020-01-29 DIAGNOSIS — Z87891 Personal history of nicotine dependence: Secondary | ICD-10-CM

## 2020-01-29 DIAGNOSIS — I499 Cardiac arrhythmia, unspecified: Secondary | ICD-10-CM | POA: Diagnosis not present

## 2020-01-29 DIAGNOSIS — I200 Unstable angina: Secondary | ICD-10-CM

## 2020-01-29 DIAGNOSIS — E785 Hyperlipidemia, unspecified: Secondary | ICD-10-CM | POA: Diagnosis not present

## 2020-01-29 DIAGNOSIS — I213 ST elevation (STEMI) myocardial infarction of unspecified site: Secondary | ICD-10-CM | POA: Diagnosis not present

## 2020-01-29 HISTORY — DX: Anxiety disorder, unspecified: F41.9

## 2020-01-29 LAB — CBC WITH DIFFERENTIAL/PLATELET
Abs Immature Granulocytes: 0.01 10*3/uL (ref 0.00–0.07)
Basophils Absolute: 0.1 10*3/uL (ref 0.0–0.1)
Basophils Relative: 1 %
Eosinophils Absolute: 0.1 10*3/uL (ref 0.0–0.5)
Eosinophils Relative: 2 %
HCT: 43.3 % (ref 39.0–52.0)
Hemoglobin: 14.9 g/dL (ref 13.0–17.0)
Immature Granulocytes: 0 %
Lymphocytes Relative: 48 %
Lymphs Abs: 3.1 10*3/uL (ref 0.7–4.0)
MCH: 30 pg (ref 26.0–34.0)
MCHC: 34.4 g/dL (ref 30.0–36.0)
MCV: 87.1 fL (ref 80.0–100.0)
Monocytes Absolute: 0.8 10*3/uL (ref 0.1–1.0)
Monocytes Relative: 13 %
Neutro Abs: 2.3 10*3/uL (ref 1.7–7.7)
Neutrophils Relative %: 36 %
Platelets: 201 10*3/uL (ref 150–400)
RBC: 4.97 MIL/uL (ref 4.22–5.81)
RDW: 13.6 % (ref 11.5–15.5)
WBC: 6.4 10*3/uL (ref 4.0–10.5)
nRBC: 0 % (ref 0.0–0.2)

## 2020-01-29 MED ORDER — HEPARIN SODIUM (PORCINE) 5000 UNIT/ML IJ SOLN
4000.0000 [IU] | Freq: Once | INTRAMUSCULAR | Status: AC
  Administered 2020-01-30: 4000 [IU] via INTRAVENOUS
  Filled 2020-01-29: qty 1

## 2020-01-29 MED ORDER — ONDANSETRON HCL 4 MG/2ML IJ SOLN
4.0000 mg | INTRAMUSCULAR | Status: AC
  Administered 2020-01-29: 4 mg via INTRAVENOUS
  Filled 2020-01-29: qty 2

## 2020-01-29 MED ORDER — MORPHINE SULFATE (PF) 4 MG/ML IV SOLN
4.0000 mg | Freq: Once | INTRAVENOUS | Status: AC
  Administered 2020-01-29: 4 mg via INTRAVENOUS
  Filled 2020-01-29: qty 1

## 2020-01-29 NOTE — ED Provider Notes (Addendum)
[facility] Emergency Department Provider Note  ____________________________________________   First MD Initiated Contact with Patient 01/29/20 2340     (approximate)  I have reviewed the triage vital signs and the nursing notes.   HISTORY  Chief Complaint Code STEMI and Chest Pain  Level 5 caveat:  history/ROS limited by acute/critical illness  HPI Clarence Ray is a 44 y.o. male with medical history as listed below but with no prior CAD/ACS history and a history of blood clots in the legs of the lungs.  He presents by EMS tonight after acute onset and severe chest pressure.  His daughter called EMS when symptoms persisted beyond a few minutes.  When EMS arrived he was complained of severe pain and then he had 2 separate syncopal episodes while they were getting him loaded and transported to the emergency department, during which time he was heavily diaphoretic and reportedly had a desaturation down to the 80s.  When he arrived to the ED he was awake, alert, no longer diaphoretic, and on a nonrebreather.  He was able to confirm the history to me and said that he had an essentially normal day today including working out and doing some cardio but he had no symptoms during the time he was working out.  However he said for the last couple of days he has had some intermittent feeling of "fluttering in my chest" or heart palpitations.  This is not completely unusual to him because of a history of SVT for which she used to be on diltiazem and is now on metoprolol.  He reports no alcohol or drug use.  No other medication changes.  No recent immobilizations or long trips or surgeries.  No history of blood clots.  No unilateral leg pain or swelling.  He denies recent fever/chills, sore throat, shortness of breath until the chest pressure tonight, abdominal pain.  He had some nausea associated with the chest pressure.  Prior to arrival by EMS he received a full dose of aspirin.   They withheld nitroglycerin.         Past Medical History:  Diagnosis Date  . Hyperlipidemia   . Hypertension   . SVT (supraventricular tachycardia) [facility])     Patient Active Problem List   Diagnosis Date Noted  . Unstable angina (HCC) 01/30/2020  . Atypical chest pain 05/27/2019  . Shortness of breath 05/27/2019  . Alcohol abuse 11/30/2017  . Former smoker 11/30/2017  . Paroxysmal tachycardia (HCC) 11/30/2017  . Palpitations 11/29/2017  . Obesity (BMI 30-39.9) 09/19/2017  . Mixed hyperlipidemia   . Essential hypertension     No past surgical history on file.  Prior to Admission medications   Medication Sig Start Date End Date Taking? Authorizing Provider  albuterol (PROVENTIL HFA;VENTOLIN HFA) 108 (90 Base) MCG/ACT inhaler Inhale 2 puffs into the lungs every 6 (six) hours as needed for wheezing or shortness of breath. 02/09/18  Yes Trey Sailors, PA-C  esomeprazole (NEXIUM) 20 MG capsule Take 1 capsule (20 mg total) by mouth daily. 05/27/19  Yes End, Cristal Deer, MD  hydrALAZINE (APRESOLINE) 50 MG tablet Take 1 tablet (50 mg total) by mouth 2 (two) times daily as needed. Patient taking differently: Take 50 mg by mouth 2 (two) times daily.  01/17/20  Yes Particia Nearing, PA-C  hydrochlorothiazide (HYDRODIURIL) 25 MG tablet TAKE 1 TABLET(25 MG) BY MOUTH DAILY Patient taking differently: Take 25 mg by mouth daily.  09/09/19  Yes Johnson, Megan P, DO  metoprolol  succinate (TOPROL-XL) 50 MG 24 hr tablet Take 1 tablet (50 mg total) by mouth daily. Take with or immediately following a meal. 01/17/20  Yes Particia Nearing, PA-C  senna-docusate (SENOKOT-S) 8.6-50 MG tablet Take 1 tablet by mouth 2 (two) times daily. Hold for loose stools or diarrhea 01/16/20  Yes Sondra Come, MD    Allergies Atorvastatin, Flomax [tamsulosin], and Losartan  Family History  Problem Relation Age of Onset  . Heart disease Mother   . Hypertension Mother   . Fibromyalgia Sister   .  Anxiety disorder Sister   . Stroke Paternal Grandmother   . Fibromyalgia Sister     Social History Social History   Tobacco Use  . Smoking status: Former Smoker    Quit date: 06/19/2017    Years since quitting: 2.6  . Smokeless tobacco: Never Used  Substance Use Topics  . Alcohol use: Yes    Comment: on occasion  . Drug use: No    Review of Systems Level 5 caveat:  history/ROS limited by acute/critical illness   Constitutional: No fever/chills Eyes: No visual changes. ENT: No sore throat. Cardiovascular: Chest pressure and syncope. Respiratory: +shortness of breath. Gastrointestinal: No abdominal pain.  Nausea, no vomiting.  No diarrhea.  No constipation. Genitourinary: Negative for dysuria. Musculoskeletal: Negative for neck pain.  Negative for back pain. Integumentary: Heavy diaphoresis during his episodes.  Negative for rash. Neurological: Negative for headaches, focal weakness or numbness.   ____________________________________________   PHYSICAL EXAM:  VITAL SIGNS: ED Triage Vitals  Enc Vitals Group     BP 01/29/20 2351 (!) 144/83     Pulse Rate 01/29/20 2351 73     Resp 01/29/20 2351 13     Temp 01/29/20 2351 98.7 F (37.1 C)     Temp Source 01/29/20 2351 Oral     SpO2 01/29/20 2351 100 %     Weight 01/29/20 2350 87.1 kg (192 lb)     Height 01/29/20 2350 1.676 m (5\' 6" )     Head Circumference --      Peak Flow --      Pain Score 01/29/20 2351 10     Pain Loc --      Pain Edu? --      Excl. in GC? --     Constitutional: Alert and oriented.  No acute distress at this time although he and EMS both report he was in severe distress prior to arrival. Eyes: Conjunctivae are normal.  Head: Atraumatic. Nose: No congestion/rhinnorhea. Mouth/Throat: Patient is wearing a mask. Neck: No stridor.  No meningeal signs.   Cardiovascular: Normal rate, regular rhythm. Good peripheral circulation. Grossly normal heart sounds. Respiratory: Normal respiratory effort.   No retractions. Gastrointestinal: Soft and nontender. No distention.  Musculoskeletal: No lower extremity tenderness nor edema. No gross deformities of extremities. Neurologic:  Normal speech and language. No gross focal neurologic deficits are appreciated.  Skin:  Skin is warm, dry and intact. Psychiatric: Mood and affect are normal. Speech and behavior are normal.  ____________________________________________   LABS (all labs ordered are listed, but only abnormal results are displayed)  Labs Reviewed  COMPREHENSIVE METABOLIC PANEL - Abnormal; Notable for the following components:      Result Value   Glucose, Bld 117 (*)    All other components within normal limits  LACTIC ACID, PLASMA - Abnormal; Notable for the following components:   Lactic Acid, Venous 2.7 (*)    All other components within normal limits  URINE DRUG  SCREEN, QUALITATIVE ([facility] ONLY) - Abnormal; Notable for the following components:   Opiate, Ur Screen POSITIVE (*)    All other components within normal limits  SARS CORONAVIRUS 2 BY RT PCR (HOSPITAL ORDER, PERFORMED IN [facility])  ETHANOL  LIPASE, BLOOD  BRAIN NATRIURETIC PEPTIDE  CBC WITH DIFFERENTIAL/PLATELET  MAGNESIUM  PROCALCITONIN  LACTIC ACID, PLASMA  PROTIME-INR  APTT  HEPARIN LEVEL (UNFRACTIONATED)  HIV ANTIBODY (ROUTINE TESTING W REFLEX)  CBC  BASIC METABOLIC PANEL  LIPID PANEL  TROPONIN I (HIGH SENSITIVITY)  TROPONIN I (HIGH SENSITIVITY)   ____________________________________________  EKG  ED ECG REPORT #1 I, Loleta Rose, the attending physician, personally viewed and interpreted this ECG.  Date: 01/29/2020 EKG Time: 23:39 Rate: 77 Rhythm: normal sinus rhythm QRS Axis: LAD Intervals: Nonspecific intraventricular conduction delay ST/T Wave abnormalities: Non-specific ST segment / T-wave changes, but no clear evidence of acute ischemia.  More specifically, he has T wave inversions in lead III, V3, and some questionable  elevation and aVL. Narrative Interpretation: does not meet STEMI criteria but nonspecific changes could represent some degree of ischemia.  ED ECG REPORT #2 (while patient is reporting worsening chest pressure) I, Loleta Rose, the attending physician, personally viewed and interpreted this ECG.  Date: 01/29/2020 EKG Time: 23:45 Rate: 81 Rhythm: normal sinus rhythm QRS Axis: LAD Intervals: normal ST/T Wave abnormalities: More prominent T wave inversions in lead III and more prominent ST elevation in aVL with inverted T waves also in aVF, V3, and V4.  However, still does not meet STEMI criteria but is concerning for ischemia. Narrative Interpretation: probable ischemic changes most notable in inferior and lateral leads, but does not meet STEMI criteria.     ____________________________________________  RADIOLOGY I, Loleta Rose, personally viewed and evaluated these images (plain radiographs) as part of my medical decision making, as well as reviewing the written report by the radiologist.  ED MD interpretation: No acute abnormalities identified on chest x-ray nor CTA chest.  Official radiology report(s): CT Angio Chest PE W and/or Wo Contrast  Result Date: 01/30/2020 CLINICAL DATA:  Chest tightness following exercise, initial encounter EXAM: CT ANGIOGRAPHY CHEST WITH CONTRAST TECHNIQUE: Multidetector CT imaging of the chest was performed using the standard protocol during bolus administration of intravenous contrast. Multiplanar CT image reconstructions and MIPs were obtained to evaluate the vascular anatomy. CONTRAST:  OMNIPAQUE IOHEXOL 350 MG/ML SOLN COMPARISON:  Chest x-ray from earlier in the same day. FINDINGS: Cardiovascular: Thoracic aorta shows a normal branching pattern. No cardiac enlargement is noted. No aneurysmal dilatation or dissection is seen. No coronary calcifications are noted. The pulmonary artery shows a normal branching pattern without definitive filling defect  to suggest pulmonary embolism. Mediastinum/Nodes: Thoracic inlet is within normal limits. No hilar or mediastinal adenopathy is noted. The esophagus is unremarkable. Lungs/Pleura: The lungs are well aerated bilaterally. No focal infiltrate or sizable effusion is seen. No sizable parenchymal nodules are noted. Upper Abdomen: Visualized upper abdomen is unremarkable. Musculoskeletal: No chest wall abnormality. No acute or significant osseous findings. Review of the MIP images confirms the above findings. IMPRESSION: No evidence of pulmonary emboli. No acute abnormality seen. Electronically Signed   By: Alcide Clever M.D.   On: 01/30/2020 01:31   DG Chest Port 1 View  Result Date: 01/30/2020 CLINICAL DATA:  Chest pain following exercise EXAM: PORTABLE CHEST 1 VIEW COMPARISON:  None. FINDINGS: The heart size and mediastinal contours are within normal limits. Both lungs are clear. The visualized skeletal structures are unremarkable.  IMPRESSION: No active disease. Electronically Signed   By: Inez Catalina M.D.   On: 01/30/2020 00:36    ____________________________________________   PROCEDURES   Procedure(s) performed (including Critical Care):  .Critical Care Performed by: Hinda Kehr, MD Authorized by: Hinda Kehr, MD   Critical care provider statement:    Critical care time (minutes):  45   Critical care time was exclusive of:  Separately billable procedures and treating other patients   Critical care was necessary to treat or prevent imminent or life-threatening deterioration of the following conditions:  Cardiac failure (unstable angina vs NSTEMI)   Critical care was time spent personally by me on the following activities:  Development of treatment plan with patient or surrogate, discussions with consultants, evaluation of patient's response to treatment, examination of patient, obtaining history from patient or surrogate, ordering and performing treatments and interventions, ordering and  review of laboratory studies, ordering and review of radiographic studies, pulse oximetry, re-evaluation of patient's condition and review of old charts  .1-3 Lead EKG Interpretation Performed by: Hinda Kehr, MD Authorized by: Hinda Kehr, MD     Interpretation: normal     ECG rate:  82   ECG rate assessment: normal     Rhythm: sinus rhythm     Ectopy: none     Conduction: normal       ____________________________________________   INITIAL IMPRESSION / MDM / ASSESSMENT AND PLAN / ED COURSE  As part of my medical decision making, I reviewed the following data within the [place] History obtained from family, Nursing notes reviewed and incorporated, Labs reviewed , EKG interpreted , Old chart reviewed, Radiograph reviewed , Discussed with admitting physician  and reviewed Notes from prior ED visits   Differential diagnosis includes, but is not limited to, ACS including unstable angina, PE, aortic dissection.  The patient is on the cardiac monitor to evaluate for evidence of arrhythmia and/or significant heart rate changes.  The patient's presentation is most consistent with ACS.  When I first saw him he was asymptomatic and then shortly after went back to one computer he was reporting additional chest pressure only got the second EKG which is documented above and shows more probable ischemic changes but still does not meet criteria for STEMI.  I have ordered all of the standard labs and chest x-ray, cardiac monitor, Zoll with pad placement, n.p.o. status, and he received a full dose aspirin prior to arrival.  I have ordered morphine 4 mg IV and Zofran 4 mg IV for his discomfort.  Holding on nitroglycerin given lack of mortality benefit in ACS and my concern that he may bottom out his blood pressure.  Given my high degree of concern for ACS, less likely PE, and the relatively low chance that this is a result of aortic dissection based on his presentation, I am  ordering heparin 4000 units IV bolus followed by an infusion.     Clinical Course as of Jan 30 331  Thu Jan 30, 2020  0024 The patient is noted to have a lactate>2. With the current information available to me, I don't think the patient is in septic shock. The lactate>2, is related to respiratory distress due to cardiac issues (ACS).  No leukocytosis, no fever.  Will give 500 mL NS bolus and continue to monitor.   Lactic Acid, Venous(!!): 2.7 [CF]  0031 Normal CMP.  Negative ETOH.  First troponin is normal at 4.  CBC normal, no leukocytosis.   [CF]  0454 Given the first normal high-sensitivity troponin, I think this is most likely unstable angina.  However given the symptoms including the syncope and highly variable blood pressures even though he is close to normotensive now as well as the episode of hypoxemia witnessed by EMS, I will proceed with CTA chest to rule out pulmonary embolism.  I think dissection is less likely although a gross abnormality of the aorta should be visualized on this imaging modality as well.   [CF]  0055 SARS Coronavirus 2: NEGATIVE [CF]  0055 No acute abnormalities on CXR  DG Chest Port 1 View [CF]  0139 No evidence of PE nor other acute/emergent abnormalities.  CT Angio Chest PE W and/or Wo Contrast [CF]  0205 Discussed case by phone with Dr. Arville Care, he will admit.  Patient clarified that Dr. Mariah Milling is his cardiologist.   [CF]  0211 Patient and his wife also confirmed that he has had similar but much milder episodes in the past.  Wife is at bedside and I updated her about the plan as well.   [CF]  0330 Repeat lactic acid normalized and procalcitonin is negative.   [CF]    Clinical Course User Index [CF] Loleta Rose, MD     ____________________________________________  FINAL CLINICAL IMPRESSION(S) / ED DIAGNOSES  Final diagnoses:  Unstable angina (HCC)  Chest pressure     MEDICATIONS GIVEN DURING THIS VISIT:  Medications  heparin ADULT infusion  100 units/mL (25000 units/228mL sodium chloride 0.45%) (1,150 Units/hr Intravenous New Bag/Given 01/30/20 0022)  hydrALAZINE (APRESOLINE) tablet 50 mg (has no administration in time range)  hydrochlorothiazide (HYDRODIURIL) tablet 25 mg (has no administration in time range)  metoprolol succinate (TOPROL-XL) 24 hr tablet 50 mg (has no administration in time range)  pantoprazole (PROTONIX) EC tablet 40 mg (has no administration in time range)  senna-docusate (Senokot-S) tablet 1 tablet (has no administration in time range)  albuterol (PROVENTIL) (2.5 MG/3ML) 0.083% nebulizer solution 2.5 mg (has no administration in time range)  aspirin chewable tablet 324 mg (has no administration in time range)    Or  aspirin suppository 300 mg (has no administration in time range)  aspirin EC tablet 325 mg (has no administration in time range)  nitroGLYCERIN (NITROSTAT) SL tablet 0.4 mg (has no administration in time range)  acetaminophen (TYLENOL) tablet 650 mg (has no administration in time range)  ondansetron (ZOFRAN) injection 4 mg (has no administration in time range)  0.9 %  sodium chloride infusion (has no administration in time range)  ALPRAZolam (XANAX) tablet 0.25 mg (has no administration in time range)  morphine 4 MG/ML injection 4 mg (4 mg Intravenous Given 01/29/20 2358)  ondansetron (ZOFRAN) injection 4 mg (4 mg Intravenous Given 01/29/20 2357)  heparin injection 4,000 Units (4,000 Units Intravenous Bolus 01/30/20 0024)  sodium chloride 0.9 % bolus 500 mL (500 mLs Intravenous New Bag/Given 01/30/20 0109)  iohexol (OMNIPAQUE) 350 MG/ML injection 100 mL (100 mLs Intravenous Contrast Given 01/30/20 0127)     ED Discharge Orders    None      *Please note:  Clarence Ray was evaluated in Emergency Department on 01/30/2020 for the symptoms described in the history of present illness. He was evaluated in the context of the global COVID-19 pandemic, which necessitated consideration that the patient might be  at risk for infection with the SARS-CoV-2 virus that causes COVID-19. Institutional protocols and algorithms that pertain to the evaluation of patients at risk for COVID-19 are in a state of rapid change  based on information released by regulatory bodies including the CDC and federal and state organizations. These policies and algorithms were followed during the patient's care in the ED.  Some ED evaluations and interventions may be delayed as a result of limited staffing during the pandemic.*  Note:  This document was prepared using Dragon voice recognition software and may include unintentional dictation errors.   Loleta RoseForbach, Latham Kinzler, MD 01/30/20 [id number]    Loleta RoseForbach, Micha Erck, MD 01/30/20 [phone]    Loleta RoseForbach, Nyrah Demos, MD 01/30/20 [phone]

## 2020-01-29 NOTE — ED Triage Notes (Addendum)
Pt to ED from home via [facility] for CP. Per pt earlier today he was exercising around 18:30 and began to feel a chest tightness in the sternal region that progressively got worse. Per EMS pt had 2 syncope episodes along with nausea  And diaphoresis prior to their arrival while attempting get into his car and drive to [facility]. He and his wife called 911 and the fire dept came out for assessment. Fire Dept palpated a pressure of >/=200   Pt currently stating his CP is getting worse.  Positive smoking hx. Cessation 65yrs ago.   EDP at bedside at this time

## 2020-01-30 ENCOUNTER — Emergency Department: Payer: BLUE CROSS/BLUE SHIELD

## 2020-01-30 ENCOUNTER — Encounter: Payer: Self-pay | Admitting: Radiology

## 2020-01-30 ENCOUNTER — Inpatient Hospital Stay (HOSPITAL_COMMUNITY)
Admit: 2020-01-30 | Discharge: 2020-01-30 | Disposition: A | Discharge status: Home / Self Care | Payer: BLUE CROSS/BLUE SHIELD | Attending: Family Medicine | Admitting: Family Medicine

## 2020-01-30 ENCOUNTER — Encounter
Admission: EM | Disposition: A | Discharge status: Home / Self Care | Payer: Self-pay | Source: Home / Self Care | Attending: Internal Medicine

## 2020-01-30 DIAGNOSIS — R079 Chest pain, unspecified: Secondary | ICD-10-CM | POA: Diagnosis not present

## 2020-01-30 DIAGNOSIS — K581 Irritable bowel syndrome with constipation: Secondary | ICD-10-CM | POA: Diagnosis present

## 2020-01-30 DIAGNOSIS — E782 Mixed hyperlipidemia: Secondary | ICD-10-CM | POA: Diagnosis present

## 2020-01-30 DIAGNOSIS — E785 Hyperlipidemia, unspecified: Secondary | ICD-10-CM | POA: Diagnosis not present

## 2020-01-30 DIAGNOSIS — I471 Supraventricular tachycardia: Secondary | ICD-10-CM | POA: Diagnosis not present

## 2020-01-30 DIAGNOSIS — Z20822 Contact with and (suspected) exposure to covid-19: Secondary | ICD-10-CM | POA: Diagnosis present

## 2020-01-30 DIAGNOSIS — I200 Unstable angina: Secondary | ICD-10-CM | POA: Diagnosis present

## 2020-01-30 DIAGNOSIS — R0789 Other chest pain: Secondary | ICD-10-CM | POA: Diagnosis not present

## 2020-01-30 DIAGNOSIS — I10 Essential (primary) hypertension: Secondary | ICD-10-CM | POA: Diagnosis not present

## 2020-01-30 DIAGNOSIS — Z79899 Other long term (current) drug therapy: Secondary | ICD-10-CM | POA: Diagnosis not present

## 2020-01-30 DIAGNOSIS — Z87891 Personal history of nicotine dependence: Secondary | ICD-10-CM | POA: Diagnosis not present

## 2020-01-30 DIAGNOSIS — K219 Gastro-esophageal reflux disease without esophagitis: Secondary | ICD-10-CM

## 2020-01-30 DIAGNOSIS — J45909 Unspecified asthma, uncomplicated: Secondary | ICD-10-CM

## 2020-01-30 HISTORY — PX: LEFT HEART CATH AND CORONARY ANGIOGRAPHY: CATH118249

## 2020-01-30 LAB — PROCALCITONIN: Procalcitonin: <0.1 ng/mL

## 2020-01-30 LAB — APTT: aPTT: 28 seconds (ref 24–36)

## 2020-01-30 LAB — LIPID PANEL
Cholesterol: 181 mg/dL (ref 0–200)
HDL: 38 mg/dL — ABNORMAL LOW (ref >40)
LDL Cholesterol: 135 mg/dL — ABNORMAL HIGH (ref 0–99)
Total CHOL/HDL Ratio: 4.8 RATIO
Triglycerides: 42 mg/dL (ref <150)
VLDL: 8 mg/dL (ref 0–40)

## 2020-01-30 LAB — BASIC METABOLIC PANEL
Anion gap: 9 (ref 5–15)
BUN: 13 mg/dL (ref 6–20)
CO2: 26 mmol/L (ref 22–32)
Calcium: 8.7 mg/dL — ABNORMAL LOW (ref 8.9–10.3)
Chloride: 105 mmol/L (ref 98–111)
Creatinine, Ser: 1.08 mg/dL (ref 0.61–1.24)
GFR calc Af Amer: >60 mL/min (ref >60)
GFR calc non Af Amer: >60 mL/min (ref >60)
Glucose, Bld: 113 mg/dL — ABNORMAL HIGH (ref 70–99)
Potassium: 3.6 mmol/L (ref 3.5–5.1)
Sodium: 140 mmol/L (ref 135–145)

## 2020-01-30 LAB — URINE DRUG SCREEN, QUALITATIVE (ARMC ONLY)
Amphetamines, Ur Screen: NOT DETECTED
Barbiturates, Ur Screen: NOT DETECTED
Benzodiazepine, Ur Scrn: NOT DETECTED
Cannabinoid 50 Ng, Ur ~~LOC~~: NOT DETECTED
Cocaine Metabolite,Ur ~~LOC~~: NOT DETECTED
MDMA (Ecstasy)Ur Screen: NOT DETECTED
Methadone Scn, Ur: NOT DETECTED
Opiate, Ur Screen: POSITIVE — AB
Phencyclidine (PCP) Ur S: NOT DETECTED
Tricyclic, Ur Screen: NOT DETECTED

## 2020-01-30 LAB — CBC
HCT: 42.1 % (ref 39.0–52.0)
Hemoglobin: 14.5 g/dL (ref 13.0–17.0)
MCH: 30 pg (ref 26.0–34.0)
MCHC: 34.4 g/dL (ref 30.0–36.0)
MCV: 87.2 fL (ref 80.0–100.0)
Platelets: 209 10*3/uL (ref 150–400)
RBC: 4.83 MIL/uL (ref 4.22–5.81)
RDW: 13.5 % (ref 11.5–15.5)
WBC: 7.4 10*3/uL (ref 4.0–10.5)
nRBC: 0 % (ref 0.0–0.2)

## 2020-01-30 LAB — ETHANOL: Alcohol, Ethyl (B): <10 mg/dL (ref <10)

## 2020-01-30 LAB — COMPREHENSIVE METABOLIC PANEL
ALT: 13 U/L (ref 0–44)
AST: 27 U/L (ref 15–41)
Albumin: 4.3 g/dL (ref 3.5–5.0)
Alkaline Phosphatase: 71 U/L (ref 38–126)
Anion gap: 12 (ref 5–15)
BUN: 15 mg/dL (ref 6–20)
CO2: 23 mmol/L (ref 22–32)
Calcium: 9 mg/dL (ref 8.9–10.3)
Chloride: 100 mmol/L (ref 98–111)
Creatinine, Ser: 1.12 mg/dL (ref 0.61–1.24)
GFR calc Af Amer: >60 mL/min (ref >60)
GFR calc non Af Amer: >60 mL/min (ref >60)
Glucose, Bld: 117 mg/dL — ABNORMAL HIGH (ref 70–99)
Potassium: 3.8 mmol/L (ref 3.5–5.1)
Sodium: 135 mmol/L (ref 135–145)
Total Bilirubin: 0.9 mg/dL (ref 0.3–1.2)
Total Protein: 7.4 g/dL (ref 6.5–8.1)

## 2020-01-30 LAB — PROTIME-INR
INR: 1 (ref 0.8–1.2)
Prothrombin Time: 13.1 seconds (ref 11.4–15.2)

## 2020-01-30 LAB — MAGNESIUM: Magnesium: 1.8 mg/dL (ref 1.7–2.4)

## 2020-01-30 LAB — SARS CORONAVIRUS 2 BY RT PCR (HOSPITAL ORDER, PERFORMED IN ~~LOC~~ HOSPITAL LAB): SARS Coronavirus 2: NEGATIVE

## 2020-01-30 LAB — TROPONIN I (HIGH SENSITIVITY)
Troponin I (High Sensitivity): 4 ng/L (ref <18)
Troponin I (High Sensitivity): 5 ng/L (ref <18)

## 2020-01-30 LAB — BRAIN NATRIURETIC PEPTIDE: B Natriuretic Peptide: 25.4 pg/mL (ref 0.0–100.0)

## 2020-01-30 LAB — HIV ANTIBODY (ROUTINE TESTING W REFLEX): HIV Screen 4th Generation wRfx: NONREACTIVE

## 2020-01-30 LAB — LACTIC ACID, PLASMA
Lactic Acid, Venous: 1.2 mmol/L (ref 0.5–1.9)
Lactic Acid, Venous: 2.7 mmol/L (ref 0.5–1.9)

## 2020-01-30 LAB — LIPASE, BLOOD: Lipase: 28 U/L (ref 11–51)

## 2020-01-30 LAB — HEPARIN LEVEL (UNFRACTIONATED): Heparin Unfractionated: 0.87 IU/mL — ABNORMAL HIGH (ref 0.30–0.70)

## 2020-01-30 LAB — GLUCOSE, CAPILLARY: Glucose-Capillary: 97 mg/dL (ref 70–99)

## 2020-01-30 SURGERY — LEFT HEART CATH AND CORONARY ANGIOGRAPHY
Anesthesia: Moderate Sedation

## 2020-01-30 MED ORDER — SODIUM CHLORIDE 0.9 % IV SOLN: 250.0000 mL | INTRAVENOUS | Status: DC | PRN

## 2020-01-30 MED ORDER — SODIUM CHLORIDE 0.9 % WEIGHT BASED INFUSION
3.0000 mL/kg/h | INTRAVENOUS | Status: DC
  Administered 2020-01-30: 3 mL/kg/h via INTRAVENOUS

## 2020-01-30 MED ORDER — MIDAZOLAM HCL 2 MG/2ML IJ SOLN
INTRAMUSCULAR | Status: DC | PRN
  Administered 2020-01-30 (×2): 1 mg via INTRAVENOUS

## 2020-01-30 MED ORDER — METOPROLOL SUCCINATE ER 50 MG PO TB24
50.0000 mg | ORAL_TABLET | Freq: Every day | ORAL | Status: DC
  Administered 2020-01-30 – 2020-01-31 (×2): 50 mg via ORAL
  Filled 2020-01-30 (×2): qty 1

## 2020-01-30 MED ORDER — SODIUM CHLORIDE 0.9% FLUSH: 3.0000 mL | INTRAVENOUS | Status: DC | PRN

## 2020-01-30 MED ORDER — SENNOSIDES-DOCUSATE SODIUM 8.6-50 MG PO TABS
1.0000 | ORAL_TABLET | Freq: Two times a day (BID) | ORAL | Status: DC
  Administered 2020-01-30 – 2020-01-31 (×4): 1 via ORAL
  Filled 2020-01-30 (×4): qty 1

## 2020-01-30 MED ORDER — SODIUM CHLORIDE 0.9 % IV BOLUS
500.0000 mL | Freq: Once | INTRAVENOUS | Status: AC
  Administered 2020-01-30: 500 mL via INTRAVENOUS

## 2020-01-30 MED ORDER — NITROGLYCERIN 0.4 MG SL SUBL: 0.4000 mg | SUBLINGUAL_TABLET | SUBLINGUAL | Status: DC | PRN

## 2020-01-30 MED ORDER — ONDANSETRON HCL 4 MG/2ML IJ SOLN
4.0000 mg | Freq: Four times a day (QID) | INTRAMUSCULAR | Status: DC | PRN
  Administered 2020-01-30 – 2020-01-31 (×3): 4 mg via INTRAVENOUS
  Filled 2020-01-30 (×2): qty 2

## 2020-01-30 MED ORDER — SIMVASTATIN 20 MG PO TABS
20.0000 mg | ORAL_TABLET | Freq: Every day | ORAL | Status: DC
  Filled 2020-01-30: qty 1

## 2020-01-30 MED ORDER — HYDRALAZINE HCL 20 MG/ML IJ SOLN
INTRAMUSCULAR | Status: AC
  Administered 2020-01-30: 10 mg via INTRAVENOUS
  Filled 2020-01-30: qty 1

## 2020-01-30 MED ORDER — ALBUTEROL SULFATE (2.5 MG/3ML) 0.083% IN NEBU: 2.5000 mg | INHALATION_SOLUTION | Freq: Four times a day (QID) | RESPIRATORY_TRACT | Status: DC | PRN

## 2020-01-30 MED ORDER — HEPARIN SODIUM (PORCINE) 1000 UNIT/ML IJ SOLN
INTRAMUSCULAR | Status: AC
  Filled 2020-01-30: qty 1

## 2020-01-30 MED ORDER — SODIUM CHLORIDE 0.9% FLUSH: 3.0000 mL | Freq: Two times a day (BID) | INTRAVENOUS | Status: DC

## 2020-01-30 MED ORDER — LABETALOL HCL 5 MG/ML IV SOLN: 10.0000 mg | INTRAVENOUS | Status: AC | PRN

## 2020-01-30 MED ORDER — ALPRAZOLAM 0.25 MG PO TABS: 0.2500 mg | ORAL_TABLET | Freq: Two times a day (BID) | ORAL | Status: DC | PRN

## 2020-01-30 MED ORDER — SODIUM CHLORIDE 0.9% FLUSH
3.0000 mL | Freq: Two times a day (BID) | INTRAVENOUS | Status: DC
  Administered 2020-01-30 – 2020-01-31 (×3): 3 mL via INTRAVENOUS

## 2020-01-30 MED ORDER — ASPIRIN EC 325 MG PO TBEC: 325.0000 mg | DELAYED_RELEASE_TABLET | Freq: Every day | ORAL | Status: DC

## 2020-01-30 MED ORDER — HYDROCHLOROTHIAZIDE 25 MG PO TABS
25.0000 mg | ORAL_TABLET | Freq: Every day | ORAL | Status: DC
  Administered 2020-01-30 – 2020-01-31 (×2): 25 mg via ORAL
  Filled 2020-01-30 (×2): qty 1

## 2020-01-30 MED ORDER — FENTANYL CITRATE (PF) 100 MCG/2ML IJ SOLN
INTRAMUSCULAR | Status: AC
  Filled 2020-01-30: qty 2

## 2020-01-30 MED ORDER — HEPARIN (PORCINE) 25000 UT/250ML-% IV SOLN
1000.0000 [IU]/h | INTRAVENOUS | Status: DC
  Administered 2020-01-30: 1150 [IU]/h via INTRAVENOUS
  Filled 2020-01-30: qty 250

## 2020-01-30 MED ORDER — ASPIRIN 300 MG RE SUPP: 300.0000 mg | RECTAL | Status: DC

## 2020-01-30 MED ORDER — SODIUM CHLORIDE 0.9 % IV SOLN: INTRAVENOUS | Status: DC

## 2020-01-30 MED ORDER — HEPARIN (PORCINE) IN NACL 1000-0.9 UT/500ML-% IV SOLN
INTRAVENOUS | Status: DC | PRN
  Administered 2020-01-30: 500 mL

## 2020-01-30 MED ORDER — FENTANYL CITRATE (PF) 100 MCG/2ML IJ SOLN
INTRAMUSCULAR | Status: DC | PRN
  Administered 2020-01-30: 25 ug via INTRAVENOUS
  Administered 2020-01-30: 50 ug via INTRAVENOUS

## 2020-01-30 MED ORDER — ZOLPIDEM TARTRATE 5 MG PO TABS: 5.0000 mg | ORAL_TABLET | Freq: Every evening | ORAL | Status: DC | PRN

## 2020-01-30 MED ORDER — HYDRALAZINE HCL 50 MG PO TABS: 50.0000 mg | ORAL_TABLET | Freq: Four times a day (QID) | ORAL | Status: DC | PRN

## 2020-01-30 MED ORDER — ASPIRIN 81 MG PO CHEW: 324.0000 mg | CHEWABLE_TABLET | ORAL | Status: DC

## 2020-01-30 MED ORDER — HEPARIN (PORCINE) IN NACL 1000-0.9 UT/500ML-% IV SOLN
INTRAVENOUS | Status: AC
  Filled 2020-01-30: qty 1000

## 2020-01-30 MED ORDER — MIDAZOLAM HCL 2 MG/2ML IJ SOLN
INTRAMUSCULAR | Status: AC
  Filled 2020-01-30: qty 2

## 2020-01-30 MED ORDER — HYDRALAZINE HCL 20 MG/ML IJ SOLN: 10.0000 mg | INTRAMUSCULAR | Status: AC | PRN

## 2020-01-30 MED ORDER — IOHEXOL 350 MG/ML SOLN
100.0000 mL | Freq: Once | INTRAVENOUS | Status: AC | PRN
  Administered 2020-01-30: 100 mL via INTRAVENOUS

## 2020-01-30 MED ORDER — VERAPAMIL HCL 2.5 MG/ML IV SOLN
INTRAVENOUS | Status: AC
  Filled 2020-01-30: qty 2

## 2020-01-30 MED ORDER — MORPHINE SULFATE (PF) 2 MG/ML IV SOLN
2.0000 mg | INTRAVENOUS | Status: DC | PRN
  Administered 2020-01-30 (×2): 2 mg via INTRAVENOUS
  Filled 2020-01-30 (×2): qty 1

## 2020-01-30 MED ORDER — ONDANSETRON HCL 4 MG/2ML IJ SOLN
INTRAMUSCULAR | Status: AC
  Filled 2020-01-30: qty 2

## 2020-01-30 MED ORDER — PANTOPRAZOLE SODIUM 40 MG PO TBEC
40.0000 mg | DELAYED_RELEASE_TABLET | Freq: Every day | ORAL | Status: DC
  Administered 2020-01-30 – 2020-01-31 (×2): 40 mg via ORAL
  Filled 2020-01-30 (×2): qty 1

## 2020-01-30 MED ORDER — SODIUM CHLORIDE 0.9 % WEIGHT BASED INFUSION: 1.0000 mL/kg/h | INTRAVENOUS | Status: DC

## 2020-01-30 MED ORDER — IOHEXOL 300 MG/ML  SOLN
INTRAMUSCULAR | Status: DC | PRN
  Administered 2020-01-30: 100 mL

## 2020-01-30 MED ORDER — ASPIRIN EC 81 MG PO TBEC
81.0000 mg | DELAYED_RELEASE_TABLET | Freq: Every day | ORAL | Status: DC
  Administered 2020-01-31: 81 mg via ORAL
  Filled 2020-01-30: qty 1

## 2020-01-30 MED ORDER — ACETAMINOPHEN 325 MG PO TABS
650.0000 mg | ORAL_TABLET | ORAL | Status: DC | PRN
  Administered 2020-01-30 – 2020-01-31 (×2): 650 mg via ORAL
  Filled 2020-01-30 (×2): qty 2

## 2020-01-30 MED ORDER — SODIUM CHLORIDE 0.9 % IV SOLN: INTRAVENOUS | Status: AC

## 2020-01-30 MED ORDER — HEPARIN SODIUM (PORCINE) 1000 UNIT/ML IJ SOLN
INTRAMUSCULAR | Status: DC | PRN
  Administered 2020-01-30: 4000 [IU] via INTRAVENOUS

## 2020-01-30 SURGICAL SUPPLY — 10 items
CATH 5F 110X4 TIG (CATHETERS) ×1 IMPLANT
CATH INFINITI 5 FR JL3.5 (CATHETERS) ×1 IMPLANT
CATH INFINITI 5FR JK (CATHETERS) ×1 IMPLANT
CATH INFINITI JR4 5F (CATHETERS) ×1 IMPLANT
CATH LAUNCHER 5F EBU3.5 (CATHETERS) ×1 IMPLANT
DEVICE RAD TR BAND REGULAR (VASCULAR PRODUCTS) ×1 IMPLANT
GLIDESHEATH SLEND SS 6F .021 (SHEATH) ×1 IMPLANT
KIT MANI 3VAL PERCEP (MISCELLANEOUS) ×2 IMPLANT
PACK CARDIAC CATH (CUSTOM PROCEDURE TRAY) ×1 IMPLANT
WIRE ROSEN-J .035X260CM (WIRE) ×1 IMPLANT

## 2020-01-30 NOTE — H&P (Addendum)
[place] [facility] [place] NAME: Clarence Ray    MR#:  [id number]  DATE OF BIRTH:  11/22/1975  DATE OF ADMISSION:  01/29/2020  PRIMARY CARE PHYSICIAN: Volney American, PA-C   REQUESTING/REFERRING PHYSICIAN: Chest pain  CHIEF COMPLAINT:   Chief Complaint  Patient presents with  . Code STEMI  . Chest Pain    HISTORY OF PRESENT ILLNESS:  Clarence Ray  is a 44 y.o. African-American male with a known history of hypertension, dyslipidemia and SVT, presented to the emergency room with the onset of pain across his chest felt as tightness and graded 5/10 in severity with associated nausea without vomiting however with diaphoresis with radiation to his left arm and associated tingling. He had two episodes of brief syncope for few seconds per his wife without witnessed seizures. He denied any cough or hemoptysis or leg pain or edema recent travels or surgeries. No bleeding diathesis. No head injuries.  Upon presentation to the emergency room, blood pressure was 142/85 with otherwise normal vital signs. Labs revealed unremarkable CMP and lactic acid of 2.7 with procalcitonin less than 0.1 and normal CBC. High-sensitivity troponin was four and BNP 25.4. COVID-19 PCR came back negative. Portable chest x-ray showed no acute cardiopulmonary disease. Chest CTA came back negative. EKG showed sinus rhythm with a left axis deviation and a rate of 81 and T wave inversion anterolaterally and inferiorly with poor R wave progression.  The patient was given 4 mg of IV morphine sulfate and 4 mg IV Zofran as well as 500 mL IV normal saline bolus and IV heparin bolus and drip. He is admitted to a progressive cardiac unit bed for further evaluation and management. PAST MEDICAL HISTORY:   Past Medical History:  Diagnosis Date  . Hyperlipidemia   . Hypertension   . SVT (supraventricular tachycardia) (HCC)   History of asthma -GERD  PAST SURGICAL HISTORY:  No past surgical history  on file. He denied any previous surgeries  SOCIAL HISTORY:   Social History   Tobacco Use  . Smoking status: Former Smoker    Quit date: 06/19/2017    Years since quitting: 2.6  . Smokeless tobacco: Never Used  Substance Use Topics  . Alcohol use: Yes    Comment: on occasion    FAMILY HISTORY:   Family History  Problem Relation Age of Onset  . Heart disease Mother   . Hypertension Mother   . Fibromyalgia Sister   . Anxiety disorder Sister   . Stroke Paternal Grandmother   . Fibromyalgia Sister     DRUG ALLERGIES:   Allergies  Allergen Reactions  . Atorvastatin Other (See Comments)    Made him "feel weird"  . Flomax [Tamsulosin] Other (See Comments)    Nausea, lightheaded, shakiness  . Losartan Other (See Comments)    Made him "feel weird"    REVIEW OF SYSTEMS:   ROS As per history of present illness. All pertinent systems were reviewed above. Constitutional,  HEENT, cardiovascular, respiratory, GI, GU, musculoskeletal, neuro, psychiatric, endocrine,  integumentary and hematologic systems were reviewed and are otherwise  negative/unremarkable except for positive findings mentioned above in the HPI.   MEDICATIONS AT HOME:   Prior to Admission medications   Medication Sig Start Date End Date Taking? Authorizing Provider  albuterol (PROVENTIL HFA;VENTOLIN HFA) 108 (90 Base) MCG/ACT inhaler Inhale 2 puffs into the lungs every 6 (six) hours as needed for wheezing or shortness of breath. 02/09/18  Yes Trinna Post,  PA-C  esomeprazole (NEXIUM) 20 MG capsule Take 1 capsule (20 mg total) by mouth daily. 05/27/19  Yes End, Cristal Deer, MD  hydrALAZINE (APRESOLINE) 50 MG tablet Take 1 tablet (50 mg total) by mouth 2 (two) times daily as needed. Patient taking differently: Take 50 mg by mouth 2 (two) times daily.  01/17/20  Yes Particia Nearing, PA-C  hydrochlorothiazide (HYDRODIURIL) 25 MG tablet TAKE 1 TABLET(25 MG) BY MOUTH DAILY Patient taking differently:  Take 25 mg by mouth daily.  09/09/19  Yes Johnson, Megan P, DO  metoprolol succinate (TOPROL-XL) 50 MG 24 hr tablet Take 1 tablet (50 mg total) by mouth daily. Take with or immediately following a meal. 01/17/20  Yes Particia Nearing, PA-C  senna-docusate (SENOKOT-S) 8.6-50 MG tablet Take 1 tablet by mouth 2 (two) times daily. Hold for loose stools or diarrhea 01/16/20  Yes Sondra Come, MD      VITAL SIGNS:  Blood pressure 105/73, pulse 62, temperature 98.7 F (37.1 C), temperature source Oral, resp. rate 16, height 5\' 6"  (1.676 m), weight 87.1 kg, SpO2 96 %.  PHYSICAL EXAMINATION:  Physical Exam  GENERAL:  44 y.o.-year-old African-American male patient lying in the bed with no acute distress.  EYES: Pupils equal, round, reactive to light and accommodation. No scleral icterus. Extraocular muscles intact.  HEENT: Head atraumatic, normocephalic. Oropharynx and nasopharynx clear.  NECK:  Supple, no jugular venous distention. No thyroid enlargement, no tenderness.  LUNGS: Normal breath sounds bilaterally, no wheezing, rales,rhonchi or crepitation. No use of accessory muscles of respiration.  CARDIOVASCULAR: Regular rate and rhythm, S1, S2 normal. No murmurs, rubs, or gallops.  ABDOMEN: Soft, nondistended, nontender. Bowel sounds present. No organomegaly or mass.  EXTREMITIES: No pedal edema, cyanosis, or clubbing.  NEUROLOGIC: Cranial nerves II through XII are intact. Muscle strength 5/5 in all extremities. Sensation intact. Gait not checked.  PSYCHIATRIC: The patient is alert and oriented x 3.  Normal affect and good eye contact. SKIN: No obvious rash, lesion, or ulcer.   LABORATORY PANEL:   CBC Recent Labs  Lab 01/30/20 0456  WBC 7.4  HGB 14.5  HCT 42.1  PLT 209   ------------------------------------------------------------------------------------------------------------------  Chemistries  Recent Labs  Lab 01/29/20 2342 01/29/20 2342 01/30/20 0456  NA 135   < > 140    K 3.8   < > 3.6  CL 100   < > 105  CO2 23   < > 26  GLUCOSE 117*   < > 113*  BUN 15   < > 13  CREATININE 1.12   < > 1.08  CALCIUM 9.0   < > 8.7*  MG 1.8  --   --   AST 27  --   --   ALT 13  --   --   ALKPHOS 71  --   --   BILITOT 0.9  --   --    < > = values in this interval not displayed.   ------------------------------------------------------------------------------------------------------------------  Cardiac Enzymes No results for input(s): TROPONINI in the last 168 hours. ------------------------------------------------------------------------------------------------------------------  RADIOLOGY:  CT Angio Chest PE W and/or Wo Contrast  Result Date: 01/30/2020 CLINICAL DATA:  Chest tightness following exercise, initial encounter EXAM: CT ANGIOGRAPHY CHEST WITH CONTRAST TECHNIQUE: Multidetector CT imaging of the chest was performed using the standard protocol during bolus administration of intravenous contrast. Multiplanar CT image reconstructions and MIPs were obtained to evaluate the vascular anatomy. CONTRAST:  03/31/2020 OMNIPAQUE IOHEXOL 350 MG/ML SOLN COMPARISON:  Chest x-ray from earlier  in the same day. FINDINGS: Cardiovascular: Thoracic aorta shows a normal branching pattern. No cardiac enlargement is noted. No aneurysmal dilatation or dissection is seen. No coronary calcifications are noted. The pulmonary artery shows a normal branching pattern without definitive filling defect to suggest pulmonary embolism. Mediastinum/Nodes: Thoracic inlet is within normal limits. No hilar or mediastinal adenopathy is noted. The esophagus is unremarkable. Lungs/Pleura: The lungs are well aerated bilaterally. No focal infiltrate or sizable effusion is seen. No sizable parenchymal nodules are noted. Upper Abdomen: Visualized upper abdomen is unremarkable. Musculoskeletal: No chest wall abnormality. No acute or significant osseous findings. Review of the MIP images confirms the above findings.  IMPRESSION: No evidence of pulmonary emboli. No acute abnormality seen. Electronically Signed   By: Alcide Clever M.D.   On: 01/30/2020 01:31   DG Chest Port 1 View  Result Date: 01/30/2020 CLINICAL DATA:  Chest pain following exercise EXAM: PORTABLE CHEST 1 VIEW COMPARISON:  None. FINDINGS: The heart size and mediastinal contours are within normal limits. Both lungs are clear. The visualized skeletal structures are unremarkable. IMPRESSION: No active disease. Electronically Signed   By: Alcide Clever M.D.   On: 01/30/2020 00:36      IMPRESSION AND PLAN:   1. Chest pain, concerning for unstable angina/ACS. -The patient will be admitted to cardiac progressive unit bed. -We will continue him on IV heparin. -He will be placed on Zocor as he is not tolerated Lipitor in the past as well as continued on aspirin and beta-blocker therapy with Toprol-XL. -2D echo and a cardiology consultation will be obtained. I notified Dr. Sharrell Ku about the patient. -We will follow serial troponin I's.  2.  Hypertension. -We will continue his hydralazine and HCTZ as well as Toprol-XL.  3. GERD and for GI prophylaxis.. -PPI therapy will be resumed.  4. Asthma without exacerbation. -Duo nebs will be provided as needed.  5. DVT prophylaxis. -The patient will be on IV heparin as mentioned above.    All the records are reviewed and case discussed with ED provider. The plan of care was discussed in details with the patient (and family). I answered all questions. The patient agreed to proceed with the above mentioned plan. Further management will depend upon hospital course.   CODE STATUS: Full code  Status is: Inpatient  Remains inpatient appropriate because:Ongoing active pain requiring inpatient pain management, Altered mental status, Ongoing diagnostic testing needed not appropriate for outpatient work up, Unsafe d/c plan, IV treatments appropriate due to intensity of illness or inability to take PO  and Inpatient level of care appropriate due to severity of illness   Dispo: The patient is from: Home              Anticipated d/c is to: Home              Anticipated d/c date is: 2 days              Patient currently is not medically stable to d/c.   TOTAL TIME TAKING CARE OF THIS PATIENT: 55 minutes.    Hannah Beat M.D on 01/30/2020 at 7:23 AM  [facility]   From 7 PM-7 AM, contact night-coverage [email or web address]  CC: Primary care physician; Particia Nearing, PA-C   Note: This dictation was prepared with Dragon dictation along with smaller phrase technology. Any transcriptional errors that result from this process are unintentional.

## 2020-01-30 NOTE — ED Notes (Signed)
Pt taken to CT by [facility] with this RN accompanying for safety

## 2020-01-30 NOTE — Consult Note (Signed)
Cardiology Consultation:   Patient ID: Clarence Ray; [id number]; Apr 12, 1976   Admit date: 01/29/2020 Date of Consult: 01/30/2020  Primary Care Provider: Particia Nearing, PA-C Primary Cardiologist: Mariah Milling Primary Electrophysiologist:  None   Patient Profile:   Clarence Ray is a 44 y.o. male with a hx of paroxysmal SVT, alcohol and tobacco use, HTNwith renal artery ultrasound in 09/2017 negativefor RAS, and HLDwith intolerance to Lipitor who is being seen today for the evaluation of chest pain at the request of Dr. Allena Katz.  History of Present Illness:   Clarence Ray was initially seen by Dr. Weston Anna 11/2017 for evaluation of hypertension and palpitations that dated back to 04/2017, which were associated with heavy alcohol use. Ziomonitor in 11/2017 showed NSR with an average heart rate of 84 bpm (minimum 48 bpm, maximum 174 bpm), 2 SVT runs occurred with the fastest interval lasting 4 beats with a maximum rate of 174 bpm and the longest interval lasting 10 beats with an average rate of 112 bpm. SVT was detected with symptomatic patient events. Isolated PACs and PVCs were noted. Patient was started on generic diltiazem though did not feel well. And was changed to name brand cardia XT with good tolerance.   He was seen in 04/2019 with atypical chest pain with recommendation to undergo Lexiscan MPI.  This was not completed.  He was seen in 06/2019 continuing to note intermittent randomly occurring chest discomfort, shortness of breath, palpitations, and fatigue.  Coronary CTA was recommended, though not completed.  More recently, he indicated he had been doing reasonably well.  He noted 2 episodes of palpitations with associated generalized malaise and fatigue and headache that occurred randomly on 5/29.  She was in his usual state of health until he began to feel clammy in the evening of 6/2 while he was preparing his dinner.  He checked his blood pressure and noted this was  elevated in the 140s systolic.  Shortly after this he developed substernal chest pressure that is described as radiating across his chest.  There was some radiation to the left shoulder and down the left arm as well as the bilateral shoulder blades.  There was some associated shortness of breath, diaphoresis, and nausea without emesis.  Symptoms were intermittent with duration unable to be characterized any further.  Chest discomfort was rated a 5 out of 10.  He took ASA without symptom improvement.  Upon EMS arrival patient continued to note chest pain with associated diaphoresis.  There is some question as to if the patient may have had a brief episode of unresponsiveness with the patient's wife indicating this lasted approximately 1 to 2 seconds.  Upon the patient's arrival to [facility] they were found to have stable vital signs.  Initial EKG showed NSR, 77 bpm, baseline wandering, baseline artifact, nonspecific ST-T changes.  Repeat EKG showed NSR, 81 bpm, left axis deviation, ST elevation in the anterolateral leads not meeting STEMI criteria with T wave inversion in leads, aVF, V3, V4, and nonspecific ST-T changes in leads V5 and V6, poor R wave progression along the precordial leads.  CXR showed no acute process.  CTA chest was negative for PE with no acute abnormality noted.  Labs showed an initial HS-Tn of 4 with a delta of 5, BNP 25, lipase normal, ethanol < 10, covid negative, potassium 3.8-->3.6, BUN/SCr 15/1.12, AST/ALT normal, CBC unremarkable, magnesium 1.8, PCT <0.10, LDL 135. In the ED, he was started on a heparin drip, given 4 mg  of IV morphine as well as Zofran.  Continues to note intermittent chest discomfort and dyspnea this morning.  Past Medical History:  Diagnosis Date  . Hyperlipidemia   . Hypertension   . SVT (supraventricular tachycardia) (HCC)     No past surgical history on file.   Home Meds: Prior to Admission medications   Medication Sig Start Date End Date Taking?  Authorizing Provider  albuterol (PROVENTIL HFA;VENTOLIN HFA) 108 (90 Base) MCG/ACT inhaler Inhale 2 puffs into the lungs every 6 (six) hours as needed for wheezing or shortness of breath. 02/09/18  Yes Trey Sailors, PA-C  esomeprazole (NEXIUM) 20 MG capsule Take 1 capsule (20 mg total) by mouth daily. 05/27/19  Yes End, Cristal Deer, MD  hydrALAZINE (APRESOLINE) 50 MG tablet Take 1 tablet (50 mg total) by mouth 2 (two) times daily as needed. Patient taking differently: Take 50 mg by mouth 2 (two) times daily.  01/17/20  Yes Particia Nearing, PA-C  hydrochlorothiazide (HYDRODIURIL) 25 MG tablet TAKE 1 TABLET(25 MG) BY MOUTH DAILY Patient taking differently: Take 25 mg by mouth daily.  09/09/19  Yes Johnson, Megan P, DO  metoprolol succinate (TOPROL-XL) 50 MG 24 hr tablet Take 1 tablet (50 mg total) by mouth daily. Take with or immediately following a meal. 01/17/20  Yes Particia Nearing, PA-C  senna-docusate (SENOKOT-S) 8.6-50 MG tablet Take 1 tablet by mouth 2 (two) times daily. Hold for loose stools or diarrhea 01/16/20  Yes Sondra Come, MD    Inpatient Medications: Scheduled Meds: . aspirin  324 mg Oral NOW   Or  . aspirin  300 mg Rectal NOW  . [START ON 01/31/2020] aspirin EC  325 mg Oral Daily  . hydrochlorothiazide  25 mg Oral Daily  . metoprolol succinate  50 mg Oral Daily  . pantoprazole  40 mg Oral Daily  . senna-docusate  1 tablet Oral BID  . simvastatin  20 mg Oral q1800   Continuous Infusions: . sodium chloride 100 mL/hr at 01/30/20 0458  . heparin 1,150 Units/hr (01/30/20 0022)   PRN Meds: acetaminophen, albuterol, ALPRAZolam, hydrALAZINE, morphine injection, nitroGLYCERIN, ondansetron (ZOFRAN) IV  Allergies:   Allergies  Allergen Reactions  . Atorvastatin Other (See Comments)    Made him "feel weird"  . Flomax [Tamsulosin] Other (See Comments)    Nausea, lightheaded, shakiness  . Losartan Other (See Comments)    Made him "feel weird"    Social  History:   Social History   Socioeconomic History  . Marital status: Married    Spouse name: Not on file  . Number of children: Not on file  . Years of education: Not on file  . Highest education level: Not on file  Occupational History  . Not on file  Tobacco Use  . Smoking status: Former Smoker    Quit date: 06/19/2017    Years since quitting: 2.6  . Smokeless tobacco: Never Used  Substance and Sexual Activity  . Alcohol use: Yes    Comment: on occasion  . Drug use: No  . Sexual activity: Yes  Other Topics Concern  . Not on file  Social History Narrative  . Not on file   Social Determinants of Health   Financial Resource Strain:   . Difficulty of Paying Living Expenses:   Food Insecurity:   . Worried About Programme researcher, broadcasting/film/video in the Last Year:   . [employer] of Food in the Last Year:   Transportation Needs:   . Lack of  Transportation (Medical):   Marland Kitchen Lack of Transportation (Non-Medical):   Physical Activity:   . Days of Exercise per Week:   . Minutes of Exercise per Session:   Stress:   . Feeling of Stress :   Social Connections:   . Frequency of Communication with Friends and Family:   . Frequency of Social Gatherings with Friends and Family:   . Attends Religious Services:   . Active Member of Clubs or Organizations:   . Attends Archivist Meetings:   Marland Kitchen Marital Status:   Intimate Partner Violence:   . Fear of Current or Ex-Partner:   . Emotionally Abused:   Marland Kitchen Physically Abused:   . Sexually Abused:      Family History:   Family History  Problem Relation Age of Onset  . Heart disease Mother   . Hypertension Mother   . Fibromyalgia Sister   . Anxiety disorder Sister   . Stroke Paternal Grandmother   . Fibromyalgia Sister     ROS:  Review of Systems  Constitutional: Positive for diaphoresis and malaise/fatigue. Negative for chills, fever and weight loss.  HENT: Negative for congestion.   Eyes: Negative for discharge and redness.    Respiratory: Positive for shortness of breath. Negative for cough, hemoptysis, sputum production and wheezing.   Cardiovascular: Positive for chest pain and palpitations. Negative for orthopnea, claudication, leg swelling and PND.  Gastrointestinal: Positive for nausea. Negative for abdominal pain, blood in stool, heartburn, melena and vomiting.  Genitourinary: Negative for hematuria.  Musculoskeletal: Negative for falls and myalgias.  Skin: Negative for rash.  Neurological: Positive for weakness. Negative for dizziness, tingling, tremors, sensory change, speech change, focal weakness and loss of consciousness.  Endo/Heme/Allergies: Does not bruise/bleed easily.  Psychiatric/Behavioral: Negative for substance abuse. The patient is not nervous/anxious.   All other systems reviewed and are negative.     Physical Exam/Data:   Vitals:   01/30/20 0700 01/30/20 0715 01/30/20 0717 01/30/20 0720  BP:   105/73 105/73  Pulse: (!) 56 (!) 56 (!) 57 62  Resp:    16  Temp:      TempSrc:      SpO2: 96% 97% 97% 96%  Weight:      Height:       No intake or output data in the 24 hours ending 01/30/20 0923 Filed Weights   01/29/20 2350  Weight: 87.1 kg   Body mass index is 30.99 kg/m.   Physical Exam: General: Well developed, well nourished, in no acute distress. Head: Normocephalic, atraumatic, sclera non-icteric, no xanthomas, nares without discharge.  Neck: Negative for carotid bruits. JVD not elevated. Lungs: Clear bilaterally to auscultation without wheezes, rales, or rhonchi. Breathing is unlabored. Heart: RRR with S1 S2. No murmurs, rubs, or gallops appreciated.  ZOLL pads in place. Abdomen: Soft, non-tender, non-distended with normoactive bowel sounds. No hepatomegaly. No rebound/guarding. No obvious abdominal masses. Msk:  Strength and tone appear normal for age. Extremities: No clubbing or cyanosis. No edema. Distal pedal pulses are 2+ and equal bilaterally. Neuro: Alert and  oriented X 3. No facial asymmetry. No focal deficit. Moves all extremities spontaneously. Psych:  Responds to questions appropriately with a normal affect.   EKG:  The EKG was personally reviewed and demonstrates: NSR, 77 bpm, baseline wandering, baseline artifact, nonspecific ST-T changes.  Repeat EKG showed NSR, 81 bpm, left axis deviation, ST elevation in the anterolateral leads not meeting STEMI criteria with T wave inversion in leads, aVF, V3, V4, and nonspecific  ST-T changes in leads V5 and V6, poor R wave progression along the precordial leads. Telemetry:  Telemetry was personally reviewed and demonstrates: SR  Weights: [employer]iled Weights   01/29/20 2350  Weight: 87.1 kg    Relevant CV Studies:  2D echo 07/2019: 1. Left ventricular ejection fraction, by visual estimation, is 60 to  65%. The left ventricle has normal function. There is mild to moderately  increased left ventricular hypertrophy.  2. Left ventricular diastolic parameters are consistent with Grade II  diastolic dysfunction (pseudonormalization).  3. The left ventricle has no regional wall motion abnormalities.  4. Global right ventricle has normal systolic function.The right  ventricular size is normal. No increase in right ventricular wall  thickness.  5. Left atrial size was normal.  6. TR signal is inadequate for assessing pulmonary artery systolic  pressure.  Laboratory Data:  Chemistry Recent Labs  Lab 01/29/20 2342 01/30/20 0456  NA 135 140  K 3.8 3.6  CL 100 105  CO2 23 26  GLUCOSE 117* 113*  BUN 15 13  CREATININE 1.12 1.08  CALCIUM 9.0 8.7*  GFRNONAA >60 >60  GFRAA >60 >60  ANIONGAP 12 9    Recent Labs  Lab 01/29/20 2342  PROT 7.4  ALBUMIN 4.3  AST 27  ALT 13  ALKPHOS 71  BILITOT 0.9   Hematology Recent Labs  Lab 01/29/20 2342 01/30/20 0456  WBC 6.4 7.4  RBC 4.97 4.83  HGB 14.9 14.5  HCT 43.3 42.1  MCV 87.1 87.2  MCH 30.0 30.0  MCHC 34.4 34.4  RDW 13.6 13.5  PLT 201  209   Cardiac EnzymesNo results for input(s): TROPONINI in the last 168 hours. No results for input(s): TROPIPOC in the last 168 hours.  BNP Recent Labs  Lab 01/29/20 2342  BNP 25.4    DDimer No results for input(s): DDIMER in the last 168 hours.  Radiology/Studies:  CT Angio Chest PE W and/or Wo Contrast  Result Date: 01/30/2020 IMPRESSION: No evidence of pulmonary emboli. No acute abnormality seen. Electronically Signed   By: Alcide CleverMark  Lukens M.D.   On: 01/30/2020 01:31   DG Chest Port 1 View  Result Date: 01/30/2020 IMPRESSION: No active disease. Electronically Signed   By: Alcide CleverMark  Lukens M.D.   On: 01/30/2020 00:36    Assessment and Plan:   1.  Unstable angina: -Continues to note intermittent chest discomfort and dyspnea -ASA -Heparin drip -N.p.o. -Diagnostic LHC today -Echo -Risks and benefits of cardiac catheterization have been discussed with the patient including risks of bleeding, bruising, infection, kidney damage, stroke, heart attack, urgent need for bypass, injury to limb, and death. The patient understands these risks and is willing to proceed with the procedure. All questions have been answered and concerns listened to  2.  Possible syncope: -Ischemic evaluation as above -Monitor on telemetry -Echo -Uncertain if there truly was syncopal episode or not -Further recommendations pending the above  3.  Paroxysmal SVT: -Well-controlled on Cardizem CD -Upon admission he has been placed on metoprolol -In the past he has only tolerated namebrand Cardizem CD -Monitor  4.  HTN: -Blood pressure mildly elevated in the ED -Placed on metoprolol upon admission along with HCTZ and as needed hydralazine -If he does not tolerate metoprolol may need to resume PTA Cardizem  5.  HLD: -LDL of 135 from this admission -Intolerant to Lipitor -If he is found to have significant CAD on diagnostic cath recommend trial of Crestor -Follow-up as outpatient   For questions or  updates,  please contact [facility] Please consult [email or web address] for contact info under Cardiology/STEMI.   Signed, Eula Listen, PA-C [facility] Pager: [phone] 01/30/2020, 9:23 AM

## 2020-01-30 NOTE — Progress Notes (Signed)
ANTICOAGULATION CONSULT NOTE - Initial Consult  Pharmacy Consult for heparin Indication: chest pain/ACS  Allergies  Allergen Reactions  . Atorvastatin Other (See Comments)    Made him "feel weird"  . Flomax [Tamsulosin] Other (See Comments)    Nausea, lightheaded, shakiness  . Losartan Other (See Comments)    Made him "feel weird"    Patient Measurements: Height: 5\' 6"  (167.6 cm) Weight: 87.1 kg (192 lb) IBW/kg (Calculated) : 63.8 Heparin Dosing Weight: 73 kg  Vital Signs: Temp: 98.7 F (37.1 C) (06/02 2351) Temp Source: Oral (06/02 2351) BP: 110/77 (06/03 0502) Pulse Rate: 76 (06/03 0502)  Labs: Recent Labs    01/29/20 2342 01/30/20 0200 01/30/20 0456  HGB 14.9  --  14.5  HCT 43.3  --  42.1  PLT 201  --  209  APTT 28  --   --   LABPROT 13.1  --   --   INR 1.0  --   --   HEPARINUNFRC  --   --  0.87*  CREATININE 1.12  --  1.08  TROPONINIHS 4 5  --     Estimated Creatinine Clearance: 92.1 mL/min (by C-G formula based on SCr of 1.08 mg/dL).   Medical History: Past Medical History:  Diagnosis Date  . Hyperlipidemia   . Hypertension   . SVT (supraventricular tachycardia) (HCC)     Medications:  Scheduled:  . aspirin  324 mg Oral NOW   Or  . aspirin  300 mg Rectal NOW  . [START ON 01/31/2020] aspirin EC  325 mg Oral Daily  . hydrochlorothiazide  25 mg Oral Daily  . metoprolol succinate  50 mg Oral Daily  . pantoprazole  40 mg Oral Daily  . senna-docusate  1 tablet Oral BID    Assessment: Patient admitted for CP w/ diaphoresis w/ tightness s/p exercising. EKG showing sinus rhythm, trops pending, baseline CBC WNL, aPTT/INR pending, no anticoagulation PTA. Patient is being started on heparin drip for management of UA/ACS.  Goal of Therapy:  Heparin level 0.3-0.7 units/ml Monitor platelets by anticoagulation protocol: Yes   Plan:  06/03 @ 0500 HL 0.87 supratherapeutic. Will decrease rate to 1000 units/hr and will recheck HL at 1200 CBC stable will  continue to monitor.  08/03, PharmD, BCPS Clinical Pharmacist 01/30/2020,6:48 AM

## 2020-01-30 NOTE — Progress Notes (Signed)
*  PRELIMINARY RESULTS* Echocardiogram 2D Echocardiogram has been performed.  Joanette Gula Helana Macbride 01/30/2020, 11:40 AM

## 2020-01-30 NOTE — Progress Notes (Signed)
[facility] NAME: Clarence Ray    MR#:  [id number]  DATE OF BIRTH:  December 05, 1975  SUBJECTIVE:   Post cath c/o chest pain and sob Vitals stable so far  REVIEW OF SYSTEMS:   Review of Systems  Constitutional: Negative for chills, fever and weight loss.  HENT: Negative for ear discharge, ear pain and nosebleeds.   Eyes: Negative for blurred vision, pain and discharge.  Respiratory: Negative for sputum production, shortness of breath, wheezing and stridor.   Cardiovascular: Negative for chest pain, palpitations, orthopnea and PND.  Gastrointestinal: Negative for abdominal pain, diarrhea, nausea and vomiting.  Genitourinary: Negative for frequency and urgency.  Musculoskeletal: Negative for back pain and joint pain.  Neurological: Negative for sensory change, speech change, focal weakness and weakness.  Psychiatric/Behavioral: Negative for depression and hallucinations. The patient is not nervous/anxious.    Tolerating Diet:yes Tolerating PT:   DRUG ALLERGIES:   Allergies  Allergen Reactions  . Atorvastatin Other (See Comments)    Made him "feel weird"  . Flomax [Tamsulosin] Other (See Comments)    Nausea, lightheaded, shakiness  . Losartan Other (See Comments)    Made him "feel weird"    VITALS:  Blood pressure (!) 140/92, pulse 79, temperature 98.3 F (36.8 C), temperature source Oral, resp. rate 12, height 5\' 6"  (1.676 m), weight 87.1 kg, SpO2 94 %.  PHYSICAL EXAMINATION:   Physical Exam  GENERAL:  44 y.o.-year-old patient lying in the bed with no acute distress.  EYES: Pupils equal, round, reactive to light and accommodation. No scleral icterus.   HEENT: Head atraumatic, normocephalic. Oropharynx and nasopharynx clear.  NECK:  Supple, no jugular venous distention. No thyroid enlargement, no tenderness.  LUNGS: Normal breath sounds bilaterally, no wheezing, rales, rhonchi. No use of accessory muscles of respiration.   CARDIOVASCULAR: S1, S2 normal. No murmurs, rubs, or gallops.  ABDOMEN: Soft, nontender, nondistended. Bowel sounds present. No organomegaly or mass.  EXTREMITIES: No cyanosis, clubbing or edema b/l.    NEUROLOGIC: Cranial nerves II through XII are intact. No focal Motor or sensory deficits b/l.   PSYCHIATRIC:  patient is alert and oriented x 3.  SKIN: No obvious rash, lesion, or ulcer.   LABORATORY PANEL:  CBC Recent Labs  Lab 01/30/20 0456  WBC 7.4  HGB 14.5  HCT 42.1  PLT 209    Chemistries  Recent Labs  Lab 01/29/20 2342 01/29/20 2342 01/30/20 0456  NA 135   < > 140  K 3.8   < > 3.6  CL 100   < > 105  CO2 23   < > 26  GLUCOSE 117*   < > 113*  BUN 15   < > 13  CREATININE 1.12   < > 1.08  CALCIUM 9.0   < > 8.7*  MG 1.8  --   --   AST 27  --   --   ALT 13  --   --   ALKPHOS 71  --   --   BILITOT 0.9  --   --    < > = values in this interval not displayed.   Cardiac Enzymes No results for input(s): TROPONINI in the last 168 hours. RADIOLOGY:  CT Angio Chest PE W and/or Wo Contrast  Result Date: 01/30/2020 CLINICAL DATA:  Chest tightness following exercise, initial encounter EXAM: CT ANGIOGRAPHY CHEST WITH CONTRAST TECHNIQUE: Multidetector CT imaging of the chest was performed using the standard protocol during  bolus administration of intravenous contrast. Multiplanar CT image reconstructions and MIPs were obtained to evaluate the vascular anatomy. CONTRAST:  OMNIPAQUE IOHEXOL 350 MG/ML SOLN COMPARISON:  Chest x-ray from earlier in the same day. FINDINGS: Cardiovascular: Thoracic aorta shows a normal branching pattern. No cardiac enlargement is noted. No aneurysmal dilatation or dissection is seen. No coronary calcifications are noted. The pulmonary artery shows a normal branching pattern without definitive filling defect to suggest pulmonary embolism. Mediastinum/Nodes: Thoracic inlet is within normal limits. No hilar or mediastinal adenopathy is noted. The esophagus  is unremarkable. Lungs/Pleura: The lungs are well aerated bilaterally. No focal infiltrate or sizable effusion is seen. No sizable parenchymal nodules are noted. Upper Abdomen: Visualized upper abdomen is unremarkable. Musculoskeletal: No chest wall abnormality. No acute or significant osseous findings. Review of the MIP images confirms the above findings. IMPRESSION: No evidence of pulmonary emboli. No acute abnormality seen. Electronically Signed   By: Alcide Clever M.D.   On: 01/30/2020 01:31   CARDIAC CATHETERIZATION  Result Date: 01/30/2020 1.  Normal coronary arteries. 2.  Normal LV systolic function by echo. 3.  Significantly elevated blood pressure with mild to moderate elevation in left ventricular end-diastolic pressure. 4.  Very difficult catheterization via the right radial artery due to tortuous innominate artery. Recommendations: The chest pain seems to be noncardiac.  Consider alternative etiologies.  Blood pressure control is also needed.  DG Chest Port 1 View  Result Date: 01/30/2020 CLINICAL DATA:  Chest pain following exercise EXAM: PORTABLE CHEST 1 VIEW COMPARISON:  None. FINDINGS: The heart size and mediastinal contours are within normal limits. Both lungs are clear. The visualized skeletal structures are unremarkable. IMPRESSION: No active disease. Electronically Signed   By: Alcide Clever M.D.   On: 01/30/2020 00:36   ASSESSMENT AND PLAN:  Clarence Ray  is a 44 y.o. African-American male with a known history of hypertension, dyslipidemia and SVT, presented to the emergency room with the onset of pain across his chest felt as tightness and graded 5/10 in severity with associated nausea without vomiting however with diaphoresis with radiation to his left arm and associated tingling. He had two episodes of brief syncope for few seconds per his wife without witnessed seizures.  1. Chest pain, concerning for unstable angina/ACS. -pt was onon IV heparin. -underwent Cardiac cath with Dr  Kirke Corin-- normal cath -CT chest neg for PE -on Zocor as he is not tolerated Lipitor in the past as well as continued on aspirin and beta-blocker therapy with Toprol-XL. -2D echo and a cardiology consultation will be obtained.  2.  Hypertension. - continue his hydralazine and HCTZ as well as Toprol-XL.  3. GERD and for GI prophylaxis.. -PPI therapy will be resumed.  4. Asthma without exacerbation. -Duo nebs will be provided as needed.  5. DVT prophylaxis. -The patient will be on IV heparin as mentioned above.  6. H/o palpitations ?anxiety -pt c/o palpitations and on the monitor it shows his HR is 70's today -clearly not able to elicit what is making pt feel sob with Chest pain and palpitations when his w/u is negative.   Procedures:Cardiac cath Family communication :wife at bedside Consults :Cardiolgoy CODE STATUS: FULL DVT Prophylaxis :lovenox  Status is: Inpatient  Remains inpatient appropriate because:Ongoing active pain requiring inpatient pain management   Dispo: The patient is from: Home              Anticipated d/c is to: Home  Anticipated d/c date is: 1 day              Patient currently is not medically stable to d/c.        TOTAL TIME TAKING CARE OF THIS PATIENT: 35 minutes.  >50% time spent on counselling and coordination of care  Note: This dictation was prepared with Dragon dictation along with smaller phrase technology. Any transcriptional errors that result from this process are unintentional.  Clarence Ray M.D    [facility]   CC: Primary care physician; Particia Nearing, PA-CPatient ID: Clarence Ray, male   DOB: 1975/08/31, 44 y.o.   MRN: [id number]

## 2020-01-30 NOTE — Progress Notes (Signed)
At 13:23 patient c/o dyspnea and chest pressure decreased LOC noted eyes rolling back in head, c/o nausea as well.  Vss. Notified dr. Kirke Corin and dr. Jearl Klinefelter patel of the above assessment  12 lead ekg obtained, zofran 4mg  iv given, o2 at 2l [place] applied dr. at bedside to examine and assess patient. No new orders received, reviewed ekg and will discuss with dr. Kirke Corin

## 2020-01-30 NOTE — Progress Notes (Signed)
Pt reports headache. Received PRN tylenol. Pt reports chest pain about 20 min later. PRN morphine 2mg  given. States his heart feels like it may be skipping beats. Pain 6-7 of 10. Reassessed pain q53m ans patient reports pain down to 3-4. NSR on monitor with 1 PVC. Will continue to assess frequently .

## 2020-01-30 NOTE — Progress Notes (Signed)
ANTICOAGULATION CONSULT NOTE - Initial Consult  Pharmacy Consult for heparin Indication: chest pain/ACS  Allergies  Allergen Reactions  . Atorvastatin Other (See Comments)    Made him "feel weird"  . Flomax [Tamsulosin] Other (See Comments)    Nausea, lightheaded, shakiness  . Losartan Other (See Comments)    Made him "feel weird"    Patient Measurements: Height: 5\' 6"  (167.6 cm) Weight: 87.1 kg (192 lb) IBW/kg (Calculated) : 63.8 Heparin Dosing Weight: 73 kg  Vital Signs: Temp: 98.7 F (37.1 C) (06/02 2351) Temp Source: Oral (06/02 2351) BP: 144/83 (06/02 2351) Pulse Rate: 73 (06/02 2351)  Labs: Recent Labs    01/29/20 2342  HGB 14.9  HCT 43.3  PLT 201    CrCl cannot be calculated (Patient's most recent lab result is older than the maximum 21 days allowed.).   Medical History: Past Medical History:  Diagnosis Date  . Hyperlipidemia   . Hypertension   . SVT (supraventricular tachycardia) (HCC)     Medications:  Scheduled:  . heparin  4,000 Units Intravenous Once    Assessment: Patient admitted for CP w/ diaphoresis w/ tightness s/p exercising. EKG showing sinus rhythm, trops pending, baseline CBC WNL, aPTT/INR pending, no anticoagulation PTA. Patient is being started on heparin drip for management of UA/ACS.  Goal of Therapy:  Heparin level 0.3-0.7 units/ml Monitor platelets by anticoagulation protocol: Yes   Plan:  Will bolus heparin 4000 units IV x 1 Will start rate at 1150 units/hr. Will check anti-Xa at 0600 Will monitor daily CBC's and adjust per anti-Xa levels and will f/u on troponin levels/aPTT/INR  03/30/20, PharmD, BCPS Clinical Pharmacist 01/30/2020,12:13 AM

## 2020-01-31 ENCOUNTER — Other Ambulatory Visit: Payer: Self-pay | Admitting: *Deleted

## 2020-01-31 ENCOUNTER — Telehealth: Payer: Self-pay | Admitting: *Deleted

## 2020-01-31 ENCOUNTER — Encounter: Payer: Self-pay | Admitting: Cardiology

## 2020-01-31 ENCOUNTER — Telehealth: Payer: Self-pay | Admitting: Cardiovascular Disease

## 2020-01-31 ENCOUNTER — Inpatient Hospital Stay (INDEPENDENT_AMBULATORY_CARE_PROVIDER_SITE_OTHER): Payer: BLUE CROSS/BLUE SHIELD

## 2020-01-31 DIAGNOSIS — R0789 Other chest pain: Secondary | ICD-10-CM | POA: Diagnosis not present

## 2020-01-31 DIAGNOSIS — I471 Supraventricular tachycardia: Secondary | ICD-10-CM

## 2020-01-31 DIAGNOSIS — I200 Unstable angina: Secondary | ICD-10-CM | POA: Diagnosis not present

## 2020-01-31 DIAGNOSIS — I10 Essential (primary) hypertension: Secondary | ICD-10-CM | POA: Diagnosis not present

## 2020-01-31 LAB — ECHOCARDIOGRAM COMPLETE
Height: 66 in
Weight: 3072 oz

## 2020-01-31 MED ORDER — ASPIRIN 81 MG PO TBEC: 81.0000 mg | DELAYED_RELEASE_TABLET | Freq: Every day | ORAL | 0 refills | Status: DC

## 2020-01-31 MED ORDER — NEBIVOLOL HCL 10 MG PO TABS: 10.0000 mg | ORAL_TABLET | Freq: Every day | ORAL | 0 refills | Status: DC

## 2020-01-31 MED ORDER — NEBIVOLOL HCL 10 MG PO TABS: 10.0000 mg | ORAL_TABLET | Freq: Every day | ORAL | Status: DC

## 2020-01-31 MED ORDER — SIMVASTATIN 20 MG PO TABS: 20.0000 mg | ORAL_TABLET | Freq: Every day | ORAL | 0 refills | Status: DC

## 2020-01-31 NOTE — Progress Notes (Signed)
Clarence Ray to be D/C'd Home per MD order.  Discussed prescriptions and follow up appointments with the patient. Prescriptions were e-prescribed, medication list explained in detail. Pt verbalized understanding. Holter monitor has been delivered by Dr. Sheilah Pigeon office.  Allergies as of 01/31/2020      Reactions   Atorvastatin Other (See Comments)   Made him "feel weird"   Flomax [tamsulosin] Other (See Comments)   Nausea, lightheaded, shakiness   Losartan Other (See Comments)   Made him "feel weird"      Medication List    STOP taking these medications   metoprolol succinate 50 MG 24 hr tablet Commonly known as: TOPROL-XL     TAKE these medications   albuterol 108 (90 Base) MCG/ACT inhaler Commonly known as: VENTOLIN HFA Inhale 2 puffs into the lungs every 6 (six) hours as needed for wheezing or shortness of breath.   aspirin 81 MG EC tablet Take 1 tablet (81 mg total) by mouth daily. Start taking on: February 01, 2020   esomeprazole 20 MG capsule Commonly known as: NexIUM Take 1 capsule (20 mg total) by mouth daily.   hydrALAZINE 50 MG tablet Commonly known as: APRESOLINE Take 1 tablet (50 mg total) by mouth 2 (two) times daily as needed. What changed: when to take this   hydrochlorothiazide 25 MG tablet Commonly known as: HYDRODIURIL TAKE 1 TABLET(25 MG) BY MOUTH DAILY What changed: See the new instructions.   nebivolol 10 MG tablet Commonly known as: BYSTOLIC Take 1 tablet (10 mg total) by mouth daily. Start taking on: February 01, 2020   senna-docusate 8.6-50 MG tablet Commonly known as: Senokot-S Take 1 tablet by mouth 2 (two) times daily. Hold for loose stools or diarrhea   simvastatin 20 MG tablet Commonly known as: ZOCOR Take 1 tablet (20 mg total) by mouth daily at 6 PM.       Vitals:   01/31/20 0713 01/31/20 1155  BP: 121/85 (!) 135/91  Pulse: 71 67  Resp: 17 16  Temp: 98.6 F (37 C) 98.9 F (37.2 C)  SpO2: 95% 96%    Tele box removed and  returned. Skin clean, dry and intact without evidence of skin break down, no evidence of skin tears noted. IV catheter discontinued intact. Site without signs and symptoms of complications. Dressing and pressure applied. Pt denies pain at this time. No complaints noted.  An After Visit Summary was printed and given to the patient. Patient escorted via WC, and D/C home via private auto.  Clarence Ray

## 2020-01-31 NOTE — Telephone Encounter (Signed)
error 

## 2020-01-31 NOTE — Telephone Encounter (Signed)
Received message from Eula Listen, PA-C requesting ZIO XT monitor to be placed on patient prior to discharge today. Went to place on patient. Patient was disappointed when told he could not shower for 24 hours.  He would like to shower at home when he gets home today.  Eula Listen, PA-C advised it was ok for patient to apply ZIO monitor at home this afternoon after showering. ZIO process explained to patient and his wife. Patient/wife verbalized understanding.  ZIO order entered and registered on ZIO [address]. [address] [id number]

## 2020-01-31 NOTE — Progress Notes (Signed)
Patient slept throughout the night. Wife at bedside. Had no additional reports of chest nor shoulder pain.

## 2020-01-31 NOTE — Discharge Summary (Signed)
[facility] - [place] at [facility]   PATIENT NAME: Clarence Ray    MR#:  [id number]  DATE OF BIRTH:  04-19-1976  DATE OF ADMISSION:  01/29/2020 ADMITTING PHYSICIAN: Hannah Beat, MD  DATE OF DISCHARGE: 01/31/2020  PRIMARY CARE PHYSICIAN: Particia Nearing, PA-C    ADMISSION DIAGNOSIS:  Unstable angina (HCC) [I20.0] Chest pressure [R07.89]  DISCHARGE DIAGNOSIS:  Chest pain--ruled out MI H/o paroxysmal SVT HTN  SECONDARY DIAGNOSIS:   Past Medical History:  Diagnosis Date  . Anxiety   . Hyperlipidemia   . Hypertension   . SVT (supraventricular tachycardia) [facility])     HOSPITAL COURSE:   ChadPattersonis a44 y.o.African-American malewith a known history of hypertension, dyslipidemia and SVT, presented to the emergency room with the onset of pain across his chest felt as tightness and graded 5/10 in severity with associated nausea without vomiting however with diaphoresis with radiation to his left arm and associated tingling. He had two episodes of brief syncope for few seconds per his wife without witnessed seizures.  1.Chest pain, concerning for unstable angina/ACS--ruled out MI with Normal cath -pt was onon IV heparin. -underwent Cardiac cath with Dr Kirke Corin-- normal cath -CT chest neg for PE -onZocor as he is not tolerated Lipitor in the pastas well as continued on aspirin and beta-blocker therapy with Bystolic -2D echo wnl -Appreciate Dr Jari Sportsman input  2.Hypertension. - continue his hydralazine and HCTZ ,hydralazine and Bystolic (changed from ToprolXL)  3.GERD and for GI prophylaxis.. -PPI therapy will be resumed. -pt recommended get GI evaluation as outpt. He will d/w his PCP  4.Asthma without exacerbation. -Duo nebs will be provided as needed.  5.DVT prophylaxis. -ambulation  6. H/o palpitations/paroxysmal SVT ?anxiety -pt c/o palpitations and on the monitor it shows his HR is stable -Zio monitor will be applied today  prior to discharge  Ok to d/c from [facility]  Procedures:Cardiac cath Family communication :wife at bedside Consults :Cardiolgoy CODE STATUS: FULL DVT Prophylaxis :lovenox  Status is: Inpatient    Dispo: The patient is from: Home  Anticipated d/c is to: Home  Anticipated d/c date is: today  Patient currently is  medically stable to d/c.   CONSULTS OBTAINED:  Treatment Team:  Iran Ouch, MD  DRUG ALLERGIES:   Allergies  Allergen Reactions  . Atorvastatin Other (See Comments)    Made him "feel weird"  . Flomax [Tamsulosin] Other (See Comments)    Nausea, lightheaded, shakiness  . Losartan Other (See Comments)    Made him "feel weird"    DISCHARGE MEDICATIONS:   Allergies as of 01/31/2020      Reactions   Atorvastatin Other (See Comments)   Made him "feel weird"   Flomax [tamsulosin] Other (See Comments)   Nausea, lightheaded, shakiness   Losartan Other (See Comments)   Made him "feel weird"      Medication List    STOP taking these medications   metoprolol succinate 50 MG 24 hr tablet Commonly known as: TOPROL-XL     TAKE these medications   albuterol 108 (90 Base) MCG/ACT inhaler Commonly known as: VENTOLIN HFA Inhale 2 puffs into the lungs every 6 (six) hours as needed for wheezing or shortness of breath.   aspirin 81 MG EC tablet Take 1 tablet (81 mg total) by mouth daily. Start taking on: February 01, 2020   esomeprazole 20 MG capsule Commonly known as: NexIUM Take 1 capsule (20 mg total) by mouth daily.   hydrALAZINE 50 MG tablet Commonly  known as: APRESOLINE Take 1 tablet (50 mg total) by mouth 2 (two) times daily as needed. What changed: when to take this   hydrochlorothiazide 25 MG tablet Commonly known as: HYDRODIURIL TAKE 1 TABLET(25 MG) BY MOUTH DAILY What changed: See the new instructions.   nebivolol 10 MG tablet Commonly known as: BYSTOLIC Take 1 tablet (10 mg total) by mouth  daily. Start taking on: February 01, 2020   senna-docusate 8.6-50 MG tablet Commonly known as: Senokot-S Take 1 tablet by mouth 2 (two) times daily. Hold for loose stools or diarrhea   simvastatin 20 MG tablet Commonly known as: ZOCOR Take 1 tablet (20 mg total) by mouth daily at 6 PM.       If you experience worsening of your admission symptoms, develop shortness of breath, life threatening emergency, suicidal or homicidal thoughts you must seek medical attention immediately by calling 911 or calling your MD immediately  if symptoms less severe.  You Must read complete instructions/literature along with all the possible adverse reactions/side effects for all the Medicines you take and that have been prescribed to you. Take any new Medicines after you have completely understood and accept all the possible adverse reactions/side effects.   Please note  You were cared for by a hospitalist during your hospital stay. If you have any questions about your discharge medications or the care you received while you were in the hospital after you are discharged, you can call the unit and asked to speak with the hospitalist on call if the hospitalist that took care of you is not available. Once you are discharged, your primary care physician will handle any further medical issues. Please note that NO REFILLS for any discharge medications will be authorized once you are discharged, as it is imperative that you return to your primary care physician (or establish a relationship with a primary care physician if you do not have one) for your aftercare needs so that they can reassess your need for medications and monitor your lab values. Today   SUBJECTIVE  Had some nausea earlier. Feels his heart beat skipping intermittently   VITAL SIGNS:  Blood pressure (!) 135/91, pulse 67, temperature 98.9 F (37.2 C), temperature source Oral, resp. rate 16, height 5\' 6"  (1.676 m), weight 87.6 kg, SpO2 96 %.  I/O:     Intake/Output Summary (Last 24 hours) at 01/31/2020 1320 Last data filed at 01/31/2020 1104 Gross per 24 hour  Intake 1865.21 ml  Output 1250 ml  Net 615.21 ml    PHYSICAL EXAMINATION:  GENERAL:  44 y.o.-year-old patient lying in the bed with no acute distress.  EYES: Pupils equal, round, reactive to light and accommodation. No scleral icterus.  HEENT: Head atraumatic, normocephalic. Oropharynx and nasopharynx clear.  NECK:  Supple, no jugular venous distention. No thyroid enlargement, no tenderness.  LUNGS: Normal breath sounds bilaterally, no wheezing, rales,rhonchi or crepitation. No use of accessory muscles of respiration.  CARDIOVASCULAR: S1, S2 normal. No murmurs, rubs, or gallops.  ABDOMEN: Soft, non-tender, non-distended. Bowel sounds present. No organomegaly or mass.  EXTREMITIES: No pedal edema, cyanosis, or clubbing.  NEUROLOGIC: Cranial nerves II through XII are intact. Muscle strength 5/5 in all extremities. Sensation intact. Gait not checked.  PSYCHIATRIC: The patient is alert and oriented x 3.  SKIN: No obvious rash, lesion, or ulcer.   DATA REVIEW:   CBC  Recent Labs  Lab 01/30/20 0456  WBC 7.4  HGB 14.5  HCT 42.1  PLT 209  Chemistries  Recent Labs  Lab 01/29/20 2342 01/29/20 2342 01/30/20 0456  NA 135   < > 140  K 3.8   < > 3.6  CL 100   < > 105  CO2 23   < > 26  GLUCOSE 117*   < > 113*  BUN 15   < > 13  CREATININE 1.12   < > 1.08  CALCIUM 9.0   < > 8.7*  MG 1.8  --   --   AST 27  --   --   ALT 13  --   --   ALKPHOS 71  --   --   BILITOT 0.9  --   --    < > = values in this interval not displayed.    Microbiology Results   Recent Results (from the past 240 hour(s))  SARS Coronavirus 2 by RT PCR (hospital order, performed in [facility] hospital lab) Nasopharyngeal Nasopharyngeal Swab     Status: None   Collection Time: 01/29/20 11:43 PM   Specimen: Nasopharyngeal Swab  Result Value Ref Range Status   SARS Coronavirus 2 NEGATIVE  NEGATIVE Final    Comment: (NOTE) SARS-CoV-2 target nucleic acids are NOT DETECTED. The SARS-CoV-2 RNA is generally detectable in upper and lower respiratory specimens during the acute phase of infection. The lowest concentration of SARS-CoV-2 viral copies this assay can detect is 250 copies / mL. A negative result does not preclude SARS-CoV-2 infection and should not be used as the sole basis for treatment or other patient management decisions.  A negative result may occur with improper specimen collection / handling, submission of specimen other than nasopharyngeal swab, presence of viral mutation(s) within the areas targeted by this assay, and inadequate number of viral copies (<250 copies / mL). A negative result must be combined with clinical observations, patient history, and epidemiological information. Fact Sheet for Patients:   BoilerBrush.com.cy Fact Sheet for Healthcare Providers: https://pope.com/ This test is not yet approved or cleared  by the [place] FDA and has been authorized for detection and/or diagnosis of SARS-CoV-2 by FDA under an Emergency Use Authorization (EUA).  This EUA will remain in effect (meaning this test can be used) for the duration of the COVID-19 declaration under Section 564(b)(1) of the Act, 21 U.S.C. section 360bbb-3(b)(1), unless the authorization is terminated or revoked sooner. Performed at [facility], [address] [place], [place] [address]     RADIOLOGY:  CT Angio Chest PE W and/or Wo Contrast  Result Date: 01/30/2020 CLINICAL DATA:  Chest tightness following exercise, initial encounter EXAM: CT ANGIOGRAPHY CHEST WITH CONTRAST TECHNIQUE: Multidetector CT imaging of the chest was performed using the standard protocol during bolus administration of intravenous contrast. Multiplanar CT image reconstructions and MIPs were obtained to evaluate the vascular anatomy. CONTRAST:   OMNIPAQUE IOHEXOL 350 MG/ML SOLN COMPARISON:  Chest x-ray from earlier in the same day. FINDINGS: Cardiovascular: Thoracic aorta shows a normal branching pattern. No cardiac enlargement is noted. No aneurysmal dilatation or dissection is seen. No coronary calcifications are noted. The pulmonary artery shows a normal branching pattern without definitive filling defect to suggest pulmonary embolism. Mediastinum/Nodes: Thoracic inlet is within normal limits. No hilar or mediastinal adenopathy is noted. The esophagus is unremarkable. Lungs/Pleura: The lungs are well aerated bilaterally. No focal infiltrate or sizable effusion is seen. No sizable parenchymal nodules are noted. Upper Abdomen: Visualized upper abdomen is unremarkable. Musculoskeletal: No chest wall abnormality. No acute or significant osseous findings. Review  of the MIP images confirms the above findings. IMPRESSION: No evidence of pulmonary emboli. No acute abnormality seen. Electronically Signed   By: Inez Catalina M.D.   On: 01/30/2020 01:31   CARDIAC CATHETERIZATION  Result Date: 01/30/2020 1.  Normal coronary arteries. 2.  Normal LV systolic function by echo. 3.  Significantly elevated blood pressure with mild to moderate elevation in left ventricular end-diastolic pressure. 4.  Very difficult catheterization via the right radial artery due to tortuous innominate artery. Recommendations: The chest pain seems to be noncardiac.  Consider alternative etiologies.  Blood pressure control is also needed.  DG Chest Port 1 View  Result Date: 01/30/2020 CLINICAL DATA:  Chest pain following exercise EXAM: PORTABLE CHEST 1 VIEW COMPARISON:  None. FINDINGS: The heart size and mediastinal contours are within normal limits. Both lungs are clear. The visualized skeletal structures are unremarkable. IMPRESSION: No active disease. Electronically Signed   By: Inez Catalina M.D.   On: 01/30/2020 00:36   ECHOCARDIOGRAM COMPLETE  Result Date: 01/31/2020     ECHOCARDIOGRAM REPORT   Patient Name:   Mali E Fiorello Date of Exam: 01/30/2020 Medical Rec #:  [id number]        Height:       66.0 in Accession #:    [id number]       Weight:       192.0 lb Date of Birth:  07-10-1976         BSA:          1.965 m Patient Age:    16 years         BP:           139/99 mmHg Patient Gender: M                HR:           68 bpm. Exam Location:  [facility] Procedure: 2D Echo, Color Doppler and Cardiac Doppler Indications:     R07.9 Chest Pain  History:         Patient has prior history of Echocardiogram examinations. Risk                  Factors:Hypertension and Dyslipidemia.  Sonographer:     Charmayne Sheer RDCS (AE) Referring Phys:  [id number] [place] Diagnosing Phys: Kathlyn Sacramento MD  Sonographer Comments: Suboptimal apical window and no subcostal window. IMPRESSIONS  1. Left ventricular ejection fraction, by estimation, is 60 to 65%. The left ventricle has normal function. The left ventricle has no regional wall motion abnormalities. There is mild left ventricular hypertrophy. Left ventricular diastolic parameters were normal.  2. Right ventricular systolic function is normal. The right ventricular size is normal. Tricuspid regurgitation signal is inadequate for assessing PA pressure.  3. The mitral valve is normal in structure. No evidence of mitral valve regurgitation. No evidence of mitral stenosis.  4. The aortic valve is normal in structure. Aortic valve regurgitation is not visualized. No aortic stenosis is present.  5. The inferior vena cava is normal in size with greater than 50% respiratory variability, suggesting right atrial pressure of 3 mmHg. FINDINGS  Left Ventricle: Left ventricular ejection fraction, by estimation, is 60 to 65%. The left ventricle has normal function. The left ventricle has no regional wall motion abnormalities. The left ventricular internal cavity size was normal in size. There is  mild left ventricular hypertrophy. Left ventricular diastolic parameters  were normal. Right Ventricle: The right ventricular size is normal. No increase in  right ventricular wall thickness. Right ventricular systolic function is normal. Tricuspid regurgitation signal is inadequate for assessing PA pressure. Left Atrium: Left atrial size was normal in size. Right Atrium: Right atrial size was normal in size. Pericardium: There is no evidence of pericardial effusion. Mitral Valve: The mitral valve is normal in structure. Normal mobility of the mitral valve leaflets. No evidence of mitral valve regurgitation. No evidence of mitral valve stenosis. MV peak gradient, 3.6 mmHg. The mean mitral valve gradient is 1.5 mmHg. Tricuspid Valve: The tricuspid valve is normal in structure. Tricuspid valve regurgitation is not demonstrated. No evidence of tricuspid stenosis. Aortic Valve: The aortic valve is normal in structure. Aortic valve regurgitation is not visualized. No aortic stenosis is present. Aortic valve mean gradient measures 4.0 mmHg. Aortic valve peak gradient measures 8.2 mmHg. Aortic valve area, by VTI measures 3.73 cm. Pulmonic Valve: The pulmonic valve was normal in structure. Pulmonic valve regurgitation is not visualized. No evidence of pulmonic stenosis. Aorta: The aortic root is normal in size and structure. Venous: The inferior vena cava is normal in size with greater than 50% respiratory variability, suggesting right atrial pressure of 3 mmHg. IAS/Shunts: No atrial level shunt detected by color flow Doppler.  LEFT VENTRICLE PLAX 2D LVIDd:         4.20 cm  Diastology LVIDs:         2.43 cm  LV e' lateral:   5.77 cm/s LV PW:         1.03 cm  LV E/e' lateral: 12.9 LV IVS:        0.95 cm  LV e' medial:    8.05 cm/s LVOT diam:     2.40 cm  LV E/e' medial:  9.2 LV SV:         96 LV SV Index:   49 LVOT Area:     4.52 cm  LEFT ATRIUM             Index LA diam:        3.00 cm 1.53 cm/m LA Vol (A2C):   33.4 ml 16.99 ml/m LA Vol (A4C):   52.4 ml 26.66 ml/m LA Biplane Vol: 42.4 ml  21.57 ml/m  AORTIC VALVE                   PULMONIC VALVE AV Area (Vmax):    3.42 cm    PV Vmax:       1.12 m/s AV Area (Vmean):   3.47 cm    PV Vmean:      69.700 cm/s AV Area (VTI):     3.73 cm    PV VTI:        0.212 m AV Vmax:           143.00 cm/s PV Peak grad:  5.0 mmHg AV Vmean:          87.500 cm/s PV Mean grad:  2.0 mmHg AV VTI:            0.257 m AV Peak Grad:      8.2 mmHg AV Mean Grad:      4.0 mmHg LVOT Vmax:         108.00 cm/s LVOT Vmean:        67.200 cm/s LVOT VTI:          0.212 m LVOT/AV VTI ratio: 0.82  AORTA Ao Root diam: 3.80 cm MITRAL VALVE MV Area (PHT): 2.69 cm    SHUNTS MV Peak grad:  3.6 mmHg    Systemic VTI:  0.21 m MV Mean grad:  1.5 mmHg    Systemic Diam: 2.40 cm MV Vmax:       0.95 m/s MV Vmean:      60.2 cm/s MV Decel Time: 282 msec MV E velocity: 74.20 cm/s MV A velocity: 75.70 cm/s MV E/A ratio:  0.98 Lorine Bears MD Electronically signed by Lorine Bears MD Signature Date/Time: 01/30/2020/11:54:16 AM    Final (Updated)      CODE STATUS:     Code Status Orders  (From admission, onward)         Start     Ordered   01/30/20 0212  Full code  Continuous     01/30/20 0215        Code Status History    This patient has a current code status but no historical code status.   Advance Care Planning Activity       TOTAL TIME TAKING CARE OF THIS PATIENT: *40* minutes.    Enedina Finner M.D  [facility]    CC: Primary care physician; Particia Nearing, [place]

## 2020-01-31 NOTE — Progress Notes (Signed)
Progress Note  Patient Name: Clarence Ray Date of Encounter: 01/31/2020  [facility] Cardiologist: Clarence Nordmann, MD  Subjective   He feels better overall.  He continues to have intermittent episodes of chest pain and palpitations but it appears that he had the symptoms for a while now.  He seems to be very sensitive to antihypertensive medications.  Inpatient Medications    Scheduled Meds: . aspirin EC  81 mg Oral Daily  . hydrochlorothiazide  25 mg Oral Daily  . [START ON 02/01/2020] nebivolol  10 mg Oral Daily  . pantoprazole  40 mg Oral Daily  . senna-docusate  1 tablet Oral BID  . simvastatin  20 mg Oral q1800  . sodium chloride flush  3 mL Intravenous Q12H   Continuous Infusions:  PRN Meds: acetaminophen, albuterol, ALPRAZolam, hydrALAZINE, nitroGLYCERIN, ondansetron (ZOFRAN) IV, sodium chloride flush   Vital Signs    Vitals:   01/30/20 1936 01/30/20 2311 01/31/20 0403 01/31/20 0713  BP: 125/81 (!) 142/92 131/80 121/85  Pulse: 67 98 71 71  Resp: 19 20 19 17   Temp: 98.7 F (37.1 C) 99 F (37.2 C) 98.5 F (36.9 C) 98.6 F (37 C)  TempSrc: Oral Oral Oral Oral  SpO2: 100% 97% 96% 95%  Weight:      Height:        Intake/Output Summary (Last 24 hours) at 01/31/2020 1114 Last data filed at 01/31/2020 1104 Gross per 24 hour  Intake 1865.21 ml  Output 1250 ml  Net 615.21 ml   Last 3 Weights 01/30/2020 01/30/2020 01/29/2020  Weight (lbs) 193 lb 1.6 oz 192 lb 192 lb  Weight (kg) 87.59 kg 87.091 kg 87.091 kg      Telemetry    Normal sinus rhythm with no significant arrhythmia- Personally Reviewed  ECG     - Personally Reviewed  Physical Exam   GEN: No acute distress.   Neck: No JVD Cardiac: RRR, no murmurs, rubs, or gallops.  Respiratory: Clear to auscultation bilaterally. GI: Soft, nontender, non-distended  MS: No edema; No deformity. Neuro:  Nonfocal  Psych: Normal affect  Right radial pulses normal with no hematoma  Labs    High Sensitivity  Troponin:   Recent Labs  Lab 01/29/20 2342 01/30/20 0200  TROPONINIHS 4 5      Chemistry Recent Labs  Lab 01/29/20 2342 01/30/20 0456  NA 135 140  K 3.8 3.6  CL 100 105  CO2 23 26  GLUCOSE 117* 113*  BUN 15 13  CREATININE 1.12 1.08  CALCIUM 9.0 8.7*  PROT 7.4  --   ALBUMIN 4.3  --   AST 27  --   ALT 13  --   ALKPHOS 71  --   BILITOT 0.9  --   GFRNONAA >60 >60  GFRAA >60 >60  ANIONGAP 12 9     Hematology Recent Labs  Lab 01/29/20 2342 01/30/20 0456  WBC 6.4 7.4  RBC 4.97 4.83  HGB 14.9 14.5  HCT 43.3 42.1  MCV 87.1 87.2  MCH 30.0 30.0  MCHC 34.4 34.4  RDW 13.6 13.5  PLT 201 209    BNP Recent Labs  Lab 01/29/20 2342  BNP 25.4     DDimer No results for input(s): DDIMER in the last 168 hours.   Radiology    CT Angio Chest PE W and/or Wo Contrast  Result Date: 01/30/2020 CLINICAL DATA:  Chest tightness following exercise, initial encounter EXAM: CT ANGIOGRAPHY CHEST WITH CONTRAST TECHNIQUE: Multidetector CT imaging of  the chest was performed using the standard protocol during bolus administration of intravenous contrast. Multiplanar CT image reconstructions and MIPs were obtained to evaluate the vascular anatomy. CONTRAST:  139mL OMNIPAQUE IOHEXOL 350 MG/ML SOLN COMPARISON:  Chest x-ray from earlier in the same day. FINDINGS: Cardiovascular: Thoracic aorta shows a normal branching pattern. No cardiac enlargement is noted. No aneurysmal dilatation or dissection is seen. No coronary calcifications are noted. The pulmonary artery shows a normal branching pattern without definitive filling defect to suggest pulmonary embolism. Mediastinum/Nodes: Thoracic inlet is within normal limits. No hilar or mediastinal adenopathy is noted. The esophagus is unremarkable. Lungs/Pleura: The lungs are well aerated bilaterally. No focal infiltrate or sizable effusion is seen. No sizable parenchymal nodules are noted. Upper Abdomen: Visualized upper abdomen is unremarkable.  Musculoskeletal: No chest wall abnormality. No acute or significant osseous findings. Review of the MIP images confirms the above findings. IMPRESSION: No evidence of pulmonary emboli. No acute abnormality seen. Electronically Signed   By: Inez Catalina M.D.   On: 01/30/2020 01:31   CARDIAC CATHETERIZATION  Result Date: 01/30/2020 1.  Normal coronary arteries. 2.  Normal LV systolic function by echo. 3.  Significantly elevated blood pressure with mild to moderate elevation in left ventricular end-diastolic pressure. 4.  Very difficult catheterization via the right radial artery due to tortuous innominate artery. Recommendations: The chest pain seems to be noncardiac.  Consider alternative etiologies.  Blood pressure control is also needed.  DG Chest Port 1 View  Result Date: 01/30/2020 CLINICAL DATA:  Chest pain following exercise EXAM: PORTABLE CHEST 1 VIEW COMPARISON:  None. FINDINGS: The heart size and mediastinal contours are within normal limits. Both lungs are clear. The visualized skeletal structures are unremarkable. IMPRESSION: No active disease. Electronically Signed   By: Inez Catalina M.D.   On: 01/30/2020 00:36   ECHOCARDIOGRAM COMPLETE  Result Date: 01/31/2020    ECHOCARDIOGRAM REPORT   Patient Name:   Clarence Ray Date of Exam: 01/30/2020 Medical Rec #:  [id number]        Height:       66.0 in Accession #:    [id number]       Weight:       192.0 lb Date of Birth:  August 20, 1976         BSA:          1.965 m Patient Age:    44 years         BP:           139/99 mmHg Patient Gender: M                HR:           68 bpm. Exam Location:  [facility] Procedure: 2D Echo, Color Doppler and Cardiac Doppler Indications:     R07.9 Chest Pain  History:         Patient has prior history of Echocardiogram examinations. Risk                  Factors:Hypertension and Dyslipidemia.  Sonographer:     Charmayne Sheer RDCS (AE) Referring Phys:  [id number] Clarence Ray Diagnosing Phys: Clarence Sacramento MD  Sonographer Comments:  Suboptimal apical window and no subcostal window. IMPRESSIONS  1. Left ventricular ejection fraction, by estimation, is 60 to 65%. The left ventricle has normal function. The left ventricle has no regional wall motion abnormalities. There is mild left ventricular hypertrophy. Left ventricular diastolic parameters were normal.  2. Right ventricular  systolic function is normal. The right ventricular size is normal. Tricuspid regurgitation signal is inadequate for assessing PA pressure.  3. The mitral valve is normal in structure. No evidence of mitral valve regurgitation. No evidence of mitral stenosis.  4. The aortic valve is normal in structure. Aortic valve regurgitation is not visualized. No aortic stenosis is present.  5. The inferior vena cava is normal in size with greater than 50% respiratory variability, suggesting right atrial pressure of 3 mmHg. FINDINGS  Left Ventricle: Left ventricular ejection fraction, by estimation, is 60 to 65%. The left ventricle has normal function. The left ventricle has no regional wall motion abnormalities. The left ventricular internal cavity size was normal in size. There is  mild left ventricular hypertrophy. Left ventricular diastolic parameters were normal. Right Ventricle: The right ventricular size is normal. No increase in right ventricular wall thickness. Right ventricular systolic function is normal. Tricuspid regurgitation signal is inadequate for assessing PA pressure. Left Atrium: Left atrial size was normal in size. Right Atrium: Right atrial size was normal in size. Pericardium: There is no evidence of pericardial effusion. Mitral Valve: The mitral valve is normal in structure. Normal mobility of the mitral valve leaflets. No evidence of mitral valve regurgitation. No evidence of mitral valve stenosis. MV peak gradient, 3.6 mmHg. The mean mitral valve gradient is 1.5 mmHg. Tricuspid Valve: The tricuspid valve is normal in structure. Tricuspid valve regurgitation is  not demonstrated. No evidence of tricuspid stenosis. Aortic Valve: The aortic valve is normal in structure. Aortic valve regurgitation is not visualized. No aortic stenosis is present. Aortic valve mean gradient measures 4.0 mmHg. Aortic valve peak gradient measures 8.2 mmHg. Aortic valve area, by VTI measures 3.73 cm. Pulmonic Valve: The pulmonic valve was normal in structure. Pulmonic valve regurgitation is not visualized. No evidence of pulmonic stenosis. Aorta: The aortic root is normal in size and structure. Venous: The inferior vena cava is normal in size with greater than 50% respiratory variability, suggesting right atrial pressure of 3 mmHg. IAS/Shunts: No atrial level shunt detected by color flow Doppler.  LEFT VENTRICLE PLAX 2D LVIDd:         4.20 cm  Diastology LVIDs:         2.43 cm  LV e' lateral:   5.77 cm/s LV PW:         1.03 cm  LV E/e' lateral: 12.9 LV IVS:        0.95 cm  LV e' medial:    8.05 cm/s LVOT diam:     2.40 cm  LV E/e' medial:  9.2 LV SV:         96 LV SV Index:   49 LVOT Area:     4.52 cm  LEFT ATRIUM             Index LA diam:        3.00 cm 1.53 cm/m LA Vol (A2C):   33.4 ml 16.99 ml/m LA Vol (A4C):   52.4 ml 26.66 ml/m LA Biplane Vol: 42.4 ml 21.57 ml/m  AORTIC VALVE                   PULMONIC VALVE AV Area (Vmax):    3.42 cm    PV Vmax:       1.12 m/s AV Area (Vmean):   3.47 cm    PV Vmean:      69.700 cm/s AV Area (VTI):     3.73 cm    PV VTI:  0.212 m AV Vmax:           143.00 cm/s PV Peak grad:  5.0 mmHg AV Vmean:          87.500 cm/s PV Mean grad:  2.0 mmHg AV VTI:            0.257 m AV Peak Grad:      8.2 mmHg AV Mean Grad:      4.0 mmHg LVOT Vmax:         108.00 cm/s LVOT Vmean:        67.200 cm/s LVOT VTI:          0.212 m LVOT/AV VTI ratio: 0.82  AORTA Ao Root diam: 3.80 cm MITRAL VALVE MV Area (PHT): 2.69 cm    SHUNTS MV Peak grad:  3.6 mmHg    Systemic VTI:  0.21 m MV Mean grad:  1.5 mmHg    Systemic Diam: 2.40 cm MV Vmax:       0.95 m/s MV Vmean:       60.2 cm/s MV Decel Time: 282 msec MV E velocity: 74.20 cm/s MV A velocity: 75.70 cm/s MV E/A ratio:  0.98 Lorine Bears MD Electronically signed by Lorine Bears MD Signature Date/Time: 01/30/2020/11:54:16 AM    Final (Updated)     Cardiac Studies   See above results including cath report and echo report  Patient Profile     45 y.o. male with history of paroxysmal supraventricular tachycardia, refractory hypertension with no evidence of renal artery stenosis and hyperlipidemia who presented with intermittent episodes of chest pain, palpitations and dizziness.  Assessment & Plan    1.  Chest pain: The presentation was worrisome for unstable angina in spite of unremarkable EKG and normal troponin.  He underwent left heart catheterization yesterday which showed normal coronary arteries.  Echocardiogram showed normal LV systolic function.  No further cardiac work-up of this is recommended.  CTA of the chest also showed no evidence of pulmonary embolism or other acute abnormalities.  2.  History of paroxysmal supraventricular tachycardia: The patient continues to complain of intermittent palpitations and tachycardia.  We have arranged for him to have a 2-week outpatient ZIO monitor and then follow-up with Dr. Lewie Loron after.  3.  Essential hypertension: The patient seems to have intolerance to multiple antihypertensive medications.  Some of his current symptoms started when he was on diltiazem and continued when he was switched to metoprolol.  Going to switch him from metoprolol to Bystolic 10 mg daily.  Continue hydrochlorothiazide.  Given a lot of his GI symptoms, I instructed him to minimize the use of hydralazine as much as possible.  He is currently on 25 mg to be used as needed if systolic blood pressure is above 150.  4.  Chronic constipation and nausea: Some of his symptoms as result of irritable bowel syndrome.  I suggested that he increases his fiber intake.   The patient can be discharged  home from a cardiac standpoint with the monitor and will arrange for follow-up with Dr. Mariah Milling after that.     Total encounter time greater than 35 minutes including counseling ordination of care. I discussed with Dr. Allena Katz.  For questions or updates, please contact [facility] Please consult [email or web address] for contact info under        Signed, Lorine Bears, MD  01/31/2020, 11:14 AM

## 2020-02-03 ENCOUNTER — Other Ambulatory Visit: Payer: Self-pay | Admitting: Family Medicine

## 2020-02-03 ENCOUNTER — Other Ambulatory Visit: Payer: Self-pay

## 2020-02-03 ENCOUNTER — Telehealth: Payer: Self-pay

## 2020-02-03 MED ORDER — NEBIVOLOL HCL 10 MG PO TABS: 10.0000 mg | ORAL_TABLET | Freq: Every day | ORAL | 1 refills | Status: DC

## 2020-02-03 NOTE — Telephone Encounter (Signed)
Prior Authorization initiated through [email or web address] for Bystolic 10mg  tablets. KEY  Patient ID# Altha Harm BIN [id number] Clinical questions answered and progress note written by Dr. [id number] on 01/31/2020 from the hospital has been attached to PA request for review.   Your information has been submitted to [facility]. [employer] [place] will review the request and notify you of the determination decision directly, typically within 72 hours of receiving all information.  You will also receive your request decision electronically.   If [facility] has not responded within the specified timeframe or if you have any questions about your PA submission, contact [employer] [place] directly at [phone].  Waiting for determination.

## 2020-02-03 NOTE — Telephone Encounter (Signed)
°  Patient was recently discharged from the hospital on 01/31/2020  No TCM completed, patient does not qualify for TCM services due to insurance coverage/BCBS  Per discharge summary patient needs follow up with PCP, message sent to scheduling staff to reach out to patient to make appointment

## 2020-02-04 NOTE — Telephone Encounter (Signed)
Pt stated that he would schedule through mychart when he was able to look at his schedule.

## 2020-02-05 ENCOUNTER — Encounter: Payer: Self-pay | Admitting: Family

## 2020-02-05 NOTE — Telephone Encounter (Signed)
Clarence Barges, PA-C  Clarence Riley.Marget Outten, RN  It looks like in early May his PCP discontinued diltiazem and started him on metoprolol as he felt these symptoms were coming from the diltiazem. With this change his symptoms persisted, making diltiazem less likely to be the culprit. We have a couple of options.   1) Stop Bystolic and restart Cardizem CD 360 mg daily along with continuation of HCTZ and as needed hydralazine   2) Dr. Kirke Corin has a virtual spot open tomorrow morning. You can check and see if he would like that appointment to further address his concerns.    Call to patient to speak about options from Eula Listen, [place]. He would like to proceed with virtual appt tomorrow with Dr. Kirke Corin.   appt booked. Pt verbalized understanding and appreciative of call.

## 2020-02-06 ENCOUNTER — Encounter: Payer: Self-pay | Admitting: Family Medicine

## 2020-02-06 ENCOUNTER — Encounter: Payer: Self-pay | Admitting: Cardiovascular Disease

## 2020-02-06 ENCOUNTER — Other Ambulatory Visit: Payer: Self-pay

## 2020-02-06 ENCOUNTER — Telehealth (INDEPENDENT_AMBULATORY_CARE_PROVIDER_SITE_OTHER): Payer: BLUE CROSS/BLUE SHIELD | Admitting: Cardiovascular Disease

## 2020-02-06 ENCOUNTER — Ambulatory Visit: Payer: BLUE CROSS/BLUE SHIELD | Admitting: Family

## 2020-02-06 VITALS — BP 124/80 | HR 78 | Ht 66.0 in | Wt 186.2 lb

## 2020-02-06 DIAGNOSIS — I10 Essential (primary) hypertension: Secondary | ICD-10-CM

## 2020-02-06 DIAGNOSIS — R0789 Other chest pain: Secondary | ICD-10-CM

## 2020-02-06 DIAGNOSIS — I471 Supraventricular tachycardia: Secondary | ICD-10-CM | POA: Diagnosis not present

## 2020-02-06 MED ORDER — CARVEDILOL 6.25 MG PO TABS
6.2500 mg | ORAL_TABLET | Freq: Two times a day (BID) | ORAL | 5 refills | Status: DC
Start: 2020-02-06 — End: 2020-03-12

## 2020-02-06 NOTE — Patient Instructions (Signed)
Medication Instructions:  Your physician has recommended you make the following change in your medication:   1) STOP Bystolic  2) START Carvedilol 6.22 mg twice daily. An Rx has been sent to your pharmacy.  *If you need a refill on your cardiac medications before your next appointment, please call your pharmacy*   Lab Work: None ordered If you have labs (blood work) drawn today and your tests are completely normal, you will receive your results only by: Marland Kitchen MyChart Message (if you have MyChart) OR . A paper copy in the mail If you have any lab test that is abnormal or we need to change your treatment, we will call you to review the results.   Testing/Procedures: None ordered   Follow-Up: At [facility], you and your health needs are our priority.  As part of our continuing mission to provide you with exceptional heart care, we have created designated Provider Care Teams.  These Care Teams include your primary Cardiologist (physician) and Advanced Practice Providers (APPs -  Physician Assistants and Nurse Practitioners) who all work together to provide you with the care you need, when you need it.  We recommend signing up for the patient portal called "MyChart".  Sign up information is provided on this After Visit Summary.  MyChart is used to connect with patients for Virtual Visits (Telemedicine).  Patients are able to view lab/test results, encounter notes, upcoming appointments, etc.  Non-urgent messages can be sent to your provider as well.   To learn more about what you can do with MyChart, go to [email or web address].    Your next appointment:   3 month(s)   You have been referred to [facility] in [place] for sleep apnea evaluation. Their office will contact you directly to schedule the appointment.  You have been referred to the Hypertension Clinic with Dr. Duke Salvia at our [facility] office. They will contact you directly to schedule the  appointment.     The format for your next appointment:   In Person  Provider:    You may see Julien Nordmann, MD or one of the following Advanced Practice Providers on your designated Care Team:    Nicolasa Ducking, NP  Eula Listen, PA-C  Marisue Ivan, PA-C    Other Instructions N/A

## 2020-02-06 NOTE — Progress Notes (Signed)
Virtual Visit via Video Note   This visit type was conducted due to national recommendations for restrictions regarding the COVID-19 Pandemic (e.g. social distancing) in an effort to limit this patient's exposure and mitigate transmission in our community.  Due to his co-morbid illnesses, this patient is at least at moderate risk for complications without adequate follow up.  This format is felt to be most appropriate for this patient at this time.  All issues noted in this document were discussed and addressed.  A limited physical exam was performed with this format.  Please refer to the patient's chart for his consent to telehealth for [facility].   The patient was identified using 2 identifiers.  Date:  02/06/2020   ID:  Clarence Ray, DOB Mar 18, 1976, MRN [id number]  Patient Location: Home Provider Location: Office  PCP:  Particia Nearing, PA-C  Cardiologist:  Julien Nordmann, MD  Electrophysiologist:  None   Evaluation Performed:  Follow-Up Visit  Chief Complaint: Chest pain and palpitations  History of Present Illness:    Clarence Ray is a 44 y.o. male with history of paroxysmal SVT, essential hypertension and hyperlipidemia.  He had previous palpitations in the setting of heavy alcohol use.  ZIO monitor in 2019 showed 2 short runs of SVT.  He was treated with diltiazem but did not feel well with the medication.  He has history of intermittent atypical chest pain and presented recently at [facility] with chest pain and feeling clammy with elevated blood pressure.  There was a questionable episode of unresponsiveness when EMS came but the patient said that he just closed his eyes.  Evaluation revealed negative troponin and no EKG changes.  CTA was negative for pulmonary embolism.  His chest pain persisted and thus he underwent left heart catheterization which showed normal coronary arteries.  The patient was frustrated that we could not find a clear etiology for his symptoms.   He continued to have palpitations and thus a 2-week outpatient monitor was arranged.  A lot of his symptoms of fatigue and lack of energy were felt to be due to metoprolol and thus we recommended switching him to Bystolic.  He has been taking 10 mg daily but he continues to report multiple symptoms including chest pain, shortness of breath and palpitations. He appears to be extremely anxious about any medication.  He wakes up from sleep sometimes with the symptoms.  He is not aware of sleep apnea but does snore loudly.  The patient does not have symptoms concerning for COVID-19 infection (fever, chills, cough, or new shortness of breath).    Past Medical History:  Diagnosis Date  . Anxiety   . Hyperlipidemia   . Hypertension   . SVT (supraventricular tachycardia) (HCC)    Past Surgical History:  Procedure Laterality Date  . LEFT HEART CATH AND CORONARY ANGIOGRAPHY N/A 01/30/2020   Procedure: LEFT HEART CATH AND CORONARY ANGIOGRAPHY;  Surgeon: Iran Ouch, MD;  Location: [facility] INVASIVE CV LAB;  Service: Cardiovascular;  Laterality: N/A;     Current Meds  Medication Sig  . albuterol (PROVENTIL HFA;VENTOLIN HFA) 108 (90 Base) MCG/ACT inhaler Inhale 2 puffs into the lungs every 6 (six) hours as needed for wheezing or shortness of breath.  Marland Kitchen aspirin EC 81 MG EC tablet Take 1 tablet (81 mg total) by mouth daily.  Marland Kitchen esomeprazole (NEXIUM) 20 MG capsule Take 1 capsule (20 mg total) by mouth daily.  . hydrALAZINE (APRESOLINE) 50 MG tablet Take 1 tablet (50  mg total) by mouth 2 (two) times daily as needed. (Patient taking differently: Take 50 mg by mouth 2 (two) times daily. )  . hydrochlorothiazide (HYDRODIURIL) 25 MG tablet TAKE 1 TABLET(25 MG) BY MOUTH DAILY (Patient taking differently: Take 25 mg by mouth daily. )  . nebivolol (BYSTOLIC) 10 MG tablet Take 10 mg by mouth daily.  Marland Kitchen senna-docusate (SENOKOT-S) 8.6-50 MG tablet Take 1 tablet by mouth 2 (two) times daily. Hold for loose stools or  diarrhea  . simvastatin (ZOCOR) 20 MG tablet Take 1 tablet (20 mg total) by mouth daily at 6 PM.     Allergies:   Atorvastatin, Flomax [tamsulosin], and Losartan   Social History   Tobacco Use  . Smoking status: Former Smoker    Quit date: 06/19/2017    Years since quitting: 2.6  . Smokeless tobacco: Never Used  Vaping Use  . Vaping Use: Former  Substance Use Topics  . Alcohol use: Yes    Comment: on occasion  . Drug use: No     Family Hx: The patient's family history includes Anxiety disorder in his sister; Fibromyalgia in his sister and sister; Heart disease in his mother; Hypertension in his mother; Stroke in his paternal grandmother.  ROS:   Please see the history of present illness.     All other systems reviewed and are negative.   Prior CV studies:   The following studies were reviewed today:    Labs/Other Tests and Data Reviewed:    EKG:  No ECG reviewed.  Recent Labs: 01/29/2020: ALT 13; B Natriuretic Peptide 25.4; Magnesium 1.8 01/30/2020: BUN 13; Creatinine, Ser 1.08; Hemoglobin 14.5; Platelets 209; Potassium 3.6; Sodium 140   Recent Lipid Panel Lab Results  Component Value Date/Time   CHOL 181 01/30/2020 04:56 AM   CHOL 226 (H) 01/25/2019 09:20 AM   TRIG 42 01/30/2020 04:56 AM   HDL 38 (L) 01/30/2020 04:56 AM   HDL 36 (L) 01/25/2019 09:20 AM   CHOLHDL 4.8 01/30/2020 04:56 AM   LDLCALC 135 (H) 01/30/2020 04:56 AM   LDLCALC 171 (H) 01/25/2019 09:20 AM    Wt Readings from Last 3 Encounters:  02/06/20 186 lb 4 oz (84.5 kg)  01/30/20 193 lb 1.6 oz (87.6 kg)  01/17/20 198 lb (89.8 kg)     Objective:    Vital Signs:  BP 124/80 (BP Location: Left Arm, Patient Position: Sitting, Cuff Size: Normal)   Pulse 78   Ht 5\' 6"  (1.676 m)   Wt 186 lb 4 oz (84.5 kg)   BMI 30.06 kg/m    VITAL SIGNS:  reviewed GEN:  no acute distress SKIN:  no rash, lesions or ulcers. MUSCULOSKELETAL:  no obvious deformities. NEURO:  alert and oriented x 3, no obvious  focal deficit PSYCH:  Seems to be very anxious and keeps repeating the same questions.  ASSESSMENT & PLAN:    1. Recent chest pain: Negative work-up including CTA of the chest and cardiac catheterization which showed normal coronary arteries.  Echocardiogram showed normal LV systolic function and wall motion.  I suspect a lot of his symptoms are related to anxiety. 2. History of paroxysmal supraventricular tachycardia: Persistent palpitations and currently he is wearing a 2-week outpatient monitor. 3. Essential hypertension: Intolerance to multiple medications.  He reports the same symptoms with Bystolic as he had with diltiazem and metoprolol.  He also reports intolerance to losartan and amlodipine.  He does have some symptoms suggestive of sleep apnea and I am going to refer  him to pulmonary for evaluation.  I am going to switch him from Bystolic to carvedilol 6.25 mg twice daily.  Given difficulty in managing his hypertension, I am referring him to the hypertension clinic.  COVID-19 Education: The signs and symptoms of COVID-19 were discussed with the patient and how to seek care for testing (follow up with PCP or arrange E-visit).  The importance of social distancing was discussed today.  Time:   Today, I have spent 20 minutes with the patient with telehealth technology discussing the above problems.     Medication Adjustments/Labs and Tests Ordered: Current medicines are reviewed at length with the patient today.  Concerns regarding medicines are outlined above.   Tests Ordered: No orders of the defined types were placed in this encounter.   Medication Changes: No orders of the defined types were placed in this encounter.   Follow Up:  Either In Person or Virtual in 3 month(s) with Dr. Mariah Milling  Signed, Lorine Bears, MD  02/06/2020 9:00 AM    [facility]

## 2020-02-12 ENCOUNTER — Encounter: Payer: Self-pay | Admitting: Family Medicine

## 2020-02-12 ENCOUNTER — Other Ambulatory Visit: Payer: Self-pay

## 2020-02-12 ENCOUNTER — Ambulatory Visit (INDEPENDENT_AMBULATORY_CARE_PROVIDER_SITE_OTHER): Payer: BLUE CROSS/BLUE SHIELD | Admitting: Family Medicine

## 2020-02-12 VITALS — BP 131/83 | HR 71 | Temp 98.3°F | Wt 190.0 lb

## 2020-02-12 DIAGNOSIS — R6883 Chills (without fever): Secondary | ICD-10-CM

## 2020-02-12 DIAGNOSIS — I10 Essential (primary) hypertension: Secondary | ICD-10-CM

## 2020-02-12 DIAGNOSIS — K5900 Constipation, unspecified: Secondary | ICD-10-CM

## 2020-02-12 DIAGNOSIS — R7309 Other abnormal glucose: Secondary | ICD-10-CM

## 2020-02-12 DIAGNOSIS — R5383 Other fatigue: Secondary | ICD-10-CM | POA: Diagnosis not present

## 2020-02-12 DIAGNOSIS — R002 Palpitations: Secondary | ICD-10-CM

## 2020-02-12 DIAGNOSIS — F419 Anxiety disorder, unspecified: Secondary | ICD-10-CM | POA: Diagnosis not present

## 2020-02-12 DIAGNOSIS — I471 Supraventricular tachycardia: Secondary | ICD-10-CM

## 2020-02-12 DIAGNOSIS — I200 Unstable angina: Secondary | ICD-10-CM

## 2020-02-12 DIAGNOSIS — E782 Mixed hyperlipidemia: Secondary | ICD-10-CM

## 2020-02-12 MED ORDER — ALPRAZOLAM 0.25 MG PO TABS: 0.2500 mg | ORAL_TABLET | Freq: Every day | ORAL | 0 refills | Status: AC | PRN

## 2020-02-12 MED ORDER — DILTIAZEM HCL ER COATED BEADS 180 MG PO CP24: 360.0000 mg | ORAL_CAPSULE | Freq: Every day | ORAL | 1 refills | Status: DC

## 2020-02-12 MED ORDER — PRAVASTATIN SODIUM 40 MG PO TABS
40.0000 mg | ORAL_TABLET | Freq: Every day | ORAL | 1 refills | Status: DC
Start: 2020-02-12 — End: 2020-04-23

## 2020-02-12 NOTE — Patient Instructions (Signed)
Linzess for constipation.

## 2020-02-12 NOTE — Progress Notes (Signed)
BP 131/83   Pulse 71   Temp 98.3 F (36.8 C) (Oral)   Wt 190 lb (86.2 kg)   SpO2 94%   BMI 30.67 kg/m    Subjective:    Patient ID: Clarence Ray, male    DOB: 07-01-1976, 44 y.o.   MRN: [id number]  HPI: Clarence Ray is a 44 y.o. male  Chief Complaint  Patient presents with  . Chills    x about a month  . Anxiety  . Hospitalization Follow-up   Here today following up on a recent hospitalization for unstable angina. Presented to ED with acute onset CP and tightness with diaphoresis and radiation of pain down into left arm. Had a left heart catheterization during admission which was normal without blockages. Had CT Chest which was negative for PE, and normal echocardiogram. Since d/c, having intermittent episodes where he can't breath, chest gets tight, feels like something terrible will happen, chills that feel cold to his core. Notes with episodes OJ seems to calm things down but otherwise has not found anything to resolve sxs other than time. Episodes tend to last at least 30 min at a time. Does have hx of SVT on diltiazem with fairly good rate control. Under a lot of stress, and worries constantly. Has a hx of anxiety not currently being treated with anything.   Has switched back to cartia XT from b blockers due to side effects, so far doing well as long as on name brand cartia.   Constipation ongoing, trying numerous OTC products and finally the last week or so has had a couple satisfying BMs. Feels bloated, sometimes has upper abdominal pains. Has never had a colonoscopy.   Relevant past medical, surgical, family and social history reviewed and updated as indicated. Interim medical history since our last visit reviewed. Allergies and medications reviewed and updated.  Review of Systems  Per HPI unless specifically indicated above     Objective:    BP 131/83   Pulse 71   Temp 98.3 F (36.8 C) (Oral)   Wt 190 lb (86.2 kg)   SpO2 94%   BMI 30.67 kg/m   Wt  Readings from Last 3 Encounters:  02/12/20 190 lb (86.2 kg)  02/06/20 186 lb 4 oz (84.5 kg)  01/30/20 193 lb 1.6 oz (87.6 kg)    Physical Exam  Results for orders placed or performed in visit on 02/12/20  Comprehensive metabolic panel  Result Value Ref Range   Glucose 95 65 - 99 mg/dL   BUN 8 6 - 24 mg/dL   Creatinine, Ser 0.96 0.76 - 1.27 mg/dL   GFR calc non Af Amer 97 >59 mL/min/1.73   GFR calc Af Amer 112 >59 mL/min/1.73   BUN/Creatinine Ratio 8 (L) 9 - 20   Sodium 141 134 - 144 mmol/L   Potassium 4.5 3.5 - 5.2 mmol/L   Chloride 101 96 - 106 mmol/L   CO2 26 20 - 29 mmol/L   Calcium 10.2 8.7 - 10.2 mg/dL   Total Protein 7.8 6.0 - 8.5 g/dL   Albumin 5.0 4.0 - 5.0 g/dL   Globulin, Total 2.8 1.5 - 4.5 g/dL   Albumin/Globulin Ratio 1.8 1.2 - 2.2   Bilirubin Total 0.2 0.0 - 1.2 mg/dL   Alkaline Phosphatase 101 48 - 121 IU/L   AST 16 0 - 40 IU/L   ALT 11 0 - 44 IU/L  VITAMIN D 25 Hydroxy (Vit-D Deficiency, Fractures)  Result Value Ref Range  Vit D, 25-Hydroxy 34.4 30.0 - 100.0 ng/mL  TSH  Result Value Ref Range   TSH 0.590 0.450 - 4.500 uIU/mL  Vitamin B12  Result Value Ref Range   Vitamin B-12 599 232 - 1,245 pg/mL      Assessment & Plan:   Problem List Items Addressed This Visit      Cardiovascular and Mediastinum   Essential hypertension - Primary    Stable, continue current regimen with close monitoring      Relevant Medications   diltiazem (CARTIA XT) 180 MG 24 hr capsule   pravastatin (PRAVACHOL) 40 MG tablet   Other Relevant Orders   Comprehensive metabolic panel (Completed)   Paroxysmal SVT (supraventricular tachycardia) (HCC)    Back on name brand cardizem and doing well on this      Relevant Medications   diltiazem (CARTIA XT) 180 MG 24 hr capsule   pravastatin (PRAVACHOL) 40 MG tablet   Unstable angina (HCC)    Managing with medications and lifestyle, normal heart catheterization during admission. Following with Cardiology as well. Continue  current regimen      Relevant Medications   diltiazem (CARTIA XT) 180 MG 24 hr capsule   pravastatin (PRAVACHOL) 40 MG tablet     Other   Mixed hyperlipidemia    Continue statin therapy and lifestyle changes      Relevant Medications   diltiazem (CARTIA XT) 180 MG 24 hr capsule   pravastatin (PRAVACHOL) 40 MG tablet   Palpitations    Rate has been fairly well controlled on both beta blockers and diltiazem, strong suspicion that these episodes he's having are panic attacks. WIll provide very small supply of xanax to see if this helps during episodes. Will also run some basic labs to ensure blood sugars, electrolytes, blood counts are stable. Work on stress control      Constipation    Recommend linzess to see if this helps with his chronic constipation that is resistant to OTC therapies and diet changes. If no improvement after starting refer to GI for eval for colonoscopy. Pt will consider the linzess and let us know       Other Visit Diagnoses    Chills       Relevant Orders   TSH (Completed)   Anxiety       Relevant Medications   ALPRAZolam (XANAX) 0.25 MG tablet   Fatigue, unspecified type       Relevant Orders   VITAMIN D 25 Hydroxy (Vit-D Deficiency, Fractures) (Completed)   Vitamin B12 (Completed)   Elevated glucose       Relevant Orders   HgB A1c     55 minutes spent today in direct patient care and counseling  Follow up plan: Return for as scheduled.

## 2020-02-13 LAB — COMPREHENSIVE METABOLIC PANEL
ALT: 11 IU/L (ref 0–44)
AST: 16 IU/L (ref 0–40)
Albumin/Globulin Ratio: 1.8 (ref 1.2–2.2)
Albumin: 5 g/dL (ref 4.0–5.0)
Alkaline Phosphatase: 101 IU/L (ref 48–121)
BUN/Creatinine Ratio: 8 — ABNORMAL LOW (ref 9–20)
BUN: 8 mg/dL (ref 6–24)
Bilirubin Total: 0.2 mg/dL (ref 0.0–1.2)
CO2: 26 mmol/L (ref 20–29)
Calcium: 10.2 mg/dL (ref 8.7–10.2)
Chloride: 101 mmol/L (ref 96–106)
Creatinine, Ser: 0.96 mg/dL (ref 0.76–1.27)
GFR calc Af Amer: 112 mL/min/{1.73_m2} (ref >59)
GFR calc non Af Amer: 97 mL/min/{1.73_m2} (ref >59)
Globulin, Total: 2.8 g/dL (ref 1.5–4.5)
Glucose: 95 mg/dL (ref 65–99)
Potassium: 4.5 mmol/L (ref 3.5–5.2)
Sodium: 141 mmol/L (ref 134–144)
Total Protein: 7.8 g/dL (ref 6.0–8.5)

## 2020-02-13 LAB — VITAMIN D 25 HYDROXY (VIT D DEFICIENCY, FRACTURES): Vit D, 25-Hydroxy: 34.4 ng/mL (ref 30.0–100.0)

## 2020-02-13 LAB — VITAMIN B12: Vitamin B-12: 599 pg/mL (ref 232–1245)

## 2020-02-13 LAB — TSH: TSH: 0.59 u[IU]/mL (ref 0.450–4.500)

## 2020-02-14 ENCOUNTER — Ambulatory Visit: Payer: BLUE CROSS/BLUE SHIELD | Admitting: Family Medicine

## 2020-02-14 ENCOUNTER — Other Ambulatory Visit: Payer: Self-pay | Admitting: Family Medicine

## 2020-02-14 DIAGNOSIS — R7309 Other abnormal glucose: Secondary | ICD-10-CM

## 2020-02-15 ENCOUNTER — Encounter: Payer: Self-pay | Admitting: Family Medicine

## 2020-02-20 ENCOUNTER — Other Ambulatory Visit: Payer: Self-pay

## 2020-02-20 ENCOUNTER — Other Ambulatory Visit: Payer: BLUE CROSS/BLUE SHIELD

## 2020-02-20 DIAGNOSIS — R7309 Other abnormal glucose: Secondary | ICD-10-CM | POA: Diagnosis not present

## 2020-02-21 ENCOUNTER — Encounter: Payer: Self-pay | Admitting: General Practice

## 2020-02-21 ENCOUNTER — Ambulatory Visit: Payer: BLUE CROSS/BLUE SHIELD | Admitting: Family

## 2020-02-21 LAB — HEMOGLOBIN A1C
Est. average glucose Bld gHb Est-mCnc: 114 mg/dL
Hgb A1c MFr Bld: 5.6 % (ref 4.8–5.6)

## 2020-02-26 DIAGNOSIS — K5900 Constipation, unspecified: Secondary | ICD-10-CM | POA: Insufficient documentation

## 2020-02-26 NOTE — Assessment & Plan Note (Signed)
Recommend linzess to see if this helps with his chronic constipation that is resistant to OTC therapies and diet changes. If no improvement after starting refer to GI for eval for colonoscopy. Pt will consider the linzess and let us know

## 2020-02-26 NOTE — Assessment & Plan Note (Signed)
Continue statin therapy and lifestyle changes

## 2020-02-26 NOTE — Assessment & Plan Note (Signed)
Stable, continue current regimen with close monitoring

## 2020-02-26 NOTE — Assessment & Plan Note (Addendum)
Rate has been fairly well controlled on both beta blockers and diltiazem, strong suspicion that these episodes he's having are panic attacks. WIll provide very small supply of xanax to see if this helps during episodes. Will also run some basic labs to ensure blood sugars, electrolytes, blood counts are stable. Work on stress control

## 2020-02-26 NOTE — Assessment & Plan Note (Signed)
Back on name brand cardizem and doing well on this

## 2020-02-26 NOTE — Assessment & Plan Note (Signed)
Managing with medications and lifestyle, normal heart catheterization during admission. Following with Cardiology as well. Continue current regimen

## 2020-02-27 ENCOUNTER — Encounter: Payer: Self-pay | Admitting: Family Medicine

## 2020-03-02 DIAGNOSIS — R0789 Other chest pain: Secondary | ICD-10-CM | POA: Diagnosis not present

## 2020-03-02 DIAGNOSIS — Z5321 Procedure and treatment not carried out due to patient leaving prior to being seen by health care provider: Secondary | ICD-10-CM | POA: Diagnosis not present

## 2020-03-02 DIAGNOSIS — R079 Chest pain, unspecified: Secondary | ICD-10-CM | POA: Diagnosis not present

## 2020-03-03 ENCOUNTER — Encounter: Payer: Self-pay | Admitting: Family Medicine

## 2020-03-04 ENCOUNTER — Other Ambulatory Visit: Payer: Self-pay | Admitting: Urology

## 2020-03-05 ENCOUNTER — Other Ambulatory Visit: Payer: Self-pay | Admitting: Family Medicine

## 2020-03-05 MED ORDER — DILTIAZEM HCL ER COATED BEADS 240 MG PO CP24: 240.0000 mg | ORAL_CAPSULE | Freq: Every day | ORAL | 0 refills | Status: DC

## 2020-03-05 MED ORDER — CHLORTHALIDONE 50 MG PO TABS: 50.0000 mg | ORAL_TABLET | Freq: Every day | ORAL | 0 refills | Status: DC

## 2020-03-09 ENCOUNTER — Other Ambulatory Visit: Payer: Self-pay | Admitting: Family Medicine

## 2020-03-09 DIAGNOSIS — I471 Supraventricular tachycardia: Secondary | ICD-10-CM | POA: Diagnosis not present

## 2020-03-12 ENCOUNTER — Ambulatory Visit: Payer: BLUE CROSS/BLUE SHIELD | Admitting: Cardiovascular Disease

## 2020-03-12 ENCOUNTER — Encounter: Payer: Self-pay | Admitting: Cardiovascular Disease

## 2020-03-12 ENCOUNTER — Other Ambulatory Visit: Payer: Self-pay

## 2020-03-12 VITALS — BP 146/90 | HR 81 | Ht 66.0 in | Wt 187.0 lb

## 2020-03-12 DIAGNOSIS — I10 Essential (primary) hypertension: Secondary | ICD-10-CM

## 2020-03-12 DIAGNOSIS — I471 Supraventricular tachycardia: Secondary | ICD-10-CM | POA: Diagnosis not present

## 2020-03-12 DIAGNOSIS — R002 Palpitations: Secondary | ICD-10-CM

## 2020-03-12 DIAGNOSIS — R0602 Shortness of breath: Secondary | ICD-10-CM

## 2020-03-12 NOTE — Patient Instructions (Addendum)
Medication Instructions:  STOP HYDROCHLOROTHIAZIDE  START CHLORTHALIDONE 50 MG DAILY     Labwork: BMET/RENIN/ADLOSTERONE/METANEPHRINE IN 1 WEEK AT THE [place] OFFICE OR ANY [facility]    Testing/Procedures: NONE   Follow-Up: 1 MONTH WITH PHARM D 04/23/2020 AT 2:00 PM   Special Instructions:   MONITOR YOUR BLOOD PRESSURE TWICE A DAY, LOG IN BOOK PROVIDED. BRING YOUR BLOOD PRESSURE MACHINE AND BOOK TO FOLLOW UP    DASH Eating Plan DASH stands for "Dietary Approaches to Stop Hypertension." The DASH eating plan is a healthy eating plan that has been shown to reduce high blood pressure (hypertension). It may also reduce your risk for type 2 diabetes, heart disease, and stroke. The DASH eating plan may also help with weight loss. What are tips for following this plan?  General guidelines  Avoid eating more than 2,300 mg (milligrams) of salt (sodium) a day. If you have hypertension, you may need to reduce your sodium intake to 1,500 mg a day.  Limit alcohol intake to no more than 1 drink a day for nonpregnant women and 2 drinks a day for men. One drink equals 12 oz of beer, 5 oz of wine, or 1 oz of hard liquor.  Work with your health care provider to maintain a healthy body weight or to lose weight. Ask what an ideal weight is for you.  Get at least 30 minutes of exercise that causes your heart to beat faster (aerobic exercise) most days of the week. Activities may include walking, swimming, or biking.  Work with your health care provider or diet and nutrition specialist (dietitian) to adjust your eating plan to your individual calorie needs. Reading food labels   Check food labels for the amount of sodium per serving. Choose foods with less than 5 percent of the Daily Value of sodium. Generally, foods with less than 300 mg of sodium per serving fit into this eating plan.  To find whole grains, look for the word "whole" as the first word in the ingredient list. Shopping  Buy  products labeled as "low-sodium" or "no salt added."  Buy fresh foods. Avoid canned foods and premade or frozen meals. Cooking  Avoid adding salt when cooking. Use salt-free seasonings or herbs instead of table salt or sea salt. Check with your health care provider or pharmacist before using salt substitutes.  Do not fry foods. Cook foods using healthy methods such as baking, boiling, grilling, and broiling instead.  Cook with heart-healthy oils, such as olive, canola, soybean, or sunflower oil. Meal planning  Eat a balanced diet that includes: ? 5 or more servings of fruits and vegetables each day. At each meal, try to fill half of your plate with fruits and vegetables. ? Up to 6-8 servings of whole grains each day. ? Less than 6 oz of lean meat, poultry, or fish each day. A 3-oz serving of meat is about the same size as a deck of cards. One egg equals 1 oz. ? 2 servings of low-fat dairy each day. ? A serving of nuts, seeds, or beans 5 times each week. ? Heart-healthy fats. Healthy fats called Omega-3 fatty acids are found in foods such as flaxseeds and coldwater fish, like sardines, salmon, and mackerel.  Limit how much you eat of the following: ? Canned or prepackaged foods. ? Food that is high in trans fat, such as fried foods. ? Food that is high in saturated fat, such as fatty meat. ? Sweets, desserts, sugary drinks, and other foods with  added sugar. ? Full-fat dairy products.  Do not salt foods before eating.  Try to eat at least 2 vegetarian meals each week.  Eat more home-cooked food and less restaurant, buffet, and fast food.  When eating at a restaurant, ask that your food be prepared with less salt or no salt, if possible. What foods are recommended? The items listed may not be a complete list. Talk with your dietitian about what dietary choices are best for you. Grains Whole-grain or whole-wheat bread. Whole-grain or whole-wheat pasta. Brown rice. [place].  Bulgur. Whole-grain and low-sodium cereals. Pita bread. Low-fat, low-sodium crackers. Whole-wheat flour tortillas. Vegetables Fresh or frozen vegetables (raw, steamed, roasted, or grilled). Low-sodium or reduced-sodium tomato and vegetable juice. Low-sodium or reduced-sodium tomato sauce and tomato paste. Low-sodium or reduced-sodium canned vegetables. Fruits All fresh, dried, or frozen fruit. Canned fruit in natural juice (without added sugar). Meat and other protein foods Skinless chicken or [place]. Ground chicken or [place]. Pork with fat trimmed off. Fish and seafood. Egg whites. Dried beans, peas, or lentils. Unsalted nuts, nut butters, and seeds. Unsalted canned beans. Lean cuts of beef with fat trimmed off. Low-sodium, lean deli meat. Dairy Low-fat (1%) or fat-free (skim) milk. Fat-free, low-fat, or reduced-fat cheeses. Nonfat, low-sodium ricotta or cottage cheese. Low-fat or nonfat yogurt. Low-fat, low-sodium cheese. Fats and oils Soft margarine without trans fats. Vegetable oil. Low-fat, reduced-fat, or light mayonnaise and salad dressings (reduced-sodium). Canola, safflower, olive, soybean, and sunflower oils. Avocado. Seasoning and other foods Herbs. Spices. Seasoning mixes without salt. Unsalted popcorn and pretzels. Fat-free sweets. What foods are not recommended? The items listed may not be a complete list. Talk with your dietitian about what dietary choices are best for you. Grains Baked goods made with fat, such as croissants, muffins, or some breads. Dry pasta or rice meal packs. Vegetables Creamed or fried vegetables. Vegetables in a cheese sauce. Regular canned vegetables (not low-sodium or reduced-sodium). Regular canned tomato sauce and paste (not low-sodium or reduced-sodium). Regular tomato and vegetable juice (not low-sodium or reduced-sodium). Angie Fava. Olives. Fruits Canned fruit in a light or heavy syrup. Fried fruit. Fruit in cream or butter sauce. Meat and other  protein foods Fatty cuts of meat. Ribs. Fried meat. Berniece Salines. Sausage. Bologna and other processed lunch meats. Salami. Fatback. Hotdogs. Bratwurst. Salted nuts and seeds. Canned beans with added salt. Canned or smoked fish. Whole eggs or egg yolks. Chicken or [place] with skin. Dairy Whole or 2% milk, cream, and half-and-half. Whole or full-fat cream cheese. Whole-fat or sweetened yogurt. Full-fat cheese. Nondairy creamers. Whipped toppings. Processed cheese and cheese spreads. Fats and oils Butter. Stick margarine. Lard. Shortening. Ghee. Bacon fat. Tropical oils, such as coconut, palm kernel, or palm oil. Seasoning and other foods Salted popcorn and pretzels. Onion salt, garlic salt, seasoned salt, table salt, and sea salt. Worcestershire sauce. Tartar sauce. Barbecue sauce. Teriyaki sauce. Soy sauce, including reduced-sodium. Steak sauce. Canned and packaged gravies. Fish sauce. Oyster sauce. Cocktail sauce. Horseradish that you find on the shelf. Ketchup. Mustard. Meat flavorings and tenderizers. Bouillon cubes. Hot sauce and Tabasco sauce. Premade or packaged marinades. Premade or packaged taco seasonings. Relishes. Regular salad dressings. Where to find more information:  National Heart, Lung, and [place]: https://wilson-eaton.com/  American Heart Association: www.heart.org Summary  The DASH eating plan is a healthy eating plan that has been shown to reduce high blood pressure (hypertension). It may also reduce your risk for type 2 diabetes, heart disease, and stroke.  With the DASH  eating plan, you should limit salt (sodium) intake to 2,300 mg a day. If you have hypertension, you may need to reduce your sodium intake to 1,500 mg a day.  When on the DASH eating plan, aim to eat more fresh fruits and vegetables, whole grains, lean proteins, low-fat dairy, and heart-healthy fats.  Work with your health care provider or diet and nutrition specialist (dietitian) to adjust your eating plan to your  individual calorie needs. This information is not intended to replace advice given to you by your health care provider. Make sure you discuss any questions you have with your health care provider. Document Released: 08/04/2011 Document Revised: 07/28/2017 Document Reviewed: 08/08/2016 Elsevier Patient Education  2020 [employer].

## 2020-03-12 NOTE — Progress Notes (Signed)
Hypertension Clinic Initial Assessment:    Date:  03/12/2020   ID:  Clarence Ray, DOB 11/10/1975, MRN [id number]  PCP:  Particia NearingLane, Rachel Elizabeth, PA-C  Cardiologist:  Julien Nordmannimothy Gollan, MD  Nephrologist:  Referring MD: Iran OuchArida, Muhammad A, MD   CC: Hypertension  History of Present Illness:    Clarence Ray is a 44 y.o. male with a hx of SVT, hypertensin, prior tobacco abuse, and hyperlipidemia here to establish care in the hypertension clinic.  He was first diagnosed with hypertension in 2018.  He was initially treated with hydrochlorothiazide and losartan.  It helped some with his blood pressure but did not get him to goal.  Losartan was switched to amlodipine with some improvement in his blood pressure but still not to goal.  Max dose of losartan.  Took was 50 mg.  He is struggled with labile hypertension, episodes of headaches, lightheadedness, and heart pounding.  He wore an ambulatory monitor that showed episodes of SVT.  He was started on diltiazem which did not resolve his palpitations.  He also had other symptoms including constipation, migraines, and sinus congestion.  Hydralazine was added but his blood pressure remained uncontrolled.  He had an echo 01/2020 that revealed LVEF 60 to 65% and was otherwise unremarkable.  He was started on metoprolol which did help his tachycardia.  However his blood pressure remained poorly controlled and seem to get worse.  This was switched to nebivolol.  It was suggested at his last cardiology appointment that he switch to carvedilol.  He filled the prescription but did not actually start taking it.  He also saw a new PCP who recommended that he switch to chlorthalidone from hydrochlorothiazide and that he stopped hydralazine.  He did not feel comfortable with that recommendation.  Lately he has been taking hydrochlorothiazide, diltiazem, and hydralazine.  His blood pressure is remain elevated mostly in the 140s to 160s.  Occasionally it spikes to the 180s.   He has paroxysms of tachycardia and headaches.  He has been unable to exercise because it exacerbates his symptoms.  He has no exertional chest pain or shortness of breath.  He was admitted to the hospital 01/2020 with chest pain and palpitations.  Cardiac enzymes were negative.  He had a chest CT-a that was negative for pulmonary embolism.  He ultimately underwent cardiac catheterization was found to have clean coronaries.  He reported episodes of palpitations while there and had no arrhythmias on monitor.  He denies any lower extremity edema, orthopnea, or PND.  Sleep study has been ordered but not yet completed.  [name] notes that his diet is pretty good.  They cook at home and do not eat out very often.  He mostly eats chicken and [place].  He has eliminated pork from his diet.  He no longer drinks coffee or soda.  He rarely has alcohol.  He does not use any over-the-counter supplements and rarely uses NSAIDs.  Previous antihypertensives: Metoprolol- fatigue Diltiazem Losartan Amlodipine HCTZ   Past Medical History:  Diagnosis Date  . Anxiety   . Hyperlipidemia   . Hypertension   . SVT (supraventricular tachycardia) (HCC)     Past Surgical History:  Procedure Laterality Date  . LEFT HEART CATH AND CORONARY ANGIOGRAPHY N/A 01/30/2020   Procedure: LEFT HEART CATH AND CORONARY ANGIOGRAPHY;  Surgeon: Iran OuchArida, Muhammad A, MD;  Location: [facility] INVASIVE CV LAB;  Service: Cardiovascular;  Laterality: N/A;    Current Medications: Current Meds  Medication Sig  .  albuterol (PROVENTIL HFA;VENTOLIN HFA) 108 (90 Base) MCG/ACT inhaler Inhale 2 puffs into the lungs every 6 (six) hours as needed for wheezing or shortness of breath.  . ALPRAZolam (XANAX) 0.25 MG tablet Take 1 tablet (0.25 mg total) by mouth daily as needed for anxiety.  . chlorthalidone (HYGROTON) 50 MG tablet Take 1 tablet (50 mg total) by mouth daily.  Marland Kitchen diltiazem (DILACOR XR) 180 MG 24 hr capsule Take 360 mg by mouth daily.  .  [DISCONTINUED] aspirin EC 81 MG EC tablet Take 1 tablet (81 mg total) by mouth daily.  . [DISCONTINUED] carvedilol (COREG) 6.25 MG tablet Take 1 tablet (6.25 mg total) by mouth 2 (two) times daily.  . [DISCONTINUED] esomeprazole (NEXIUM) 20 MG capsule Take 1 capsule (20 mg total) by mouth daily.  . [DISCONTINUED] hydrALAZINE (APRESOLINE) 50 MG tablet Take 1 tablet (50 mg total) by mouth 2 (two) times daily as needed. (Patient taking differently: Take 50 mg by mouth 2 (two) times daily. )     Allergies:   Atorvastatin, Flomax [tamsulosin], and Losartan   Social History   Socioeconomic History  . Marital status: Married    Spouse name: Not on file  . Number of children: Not on file  . Years of education: Not on file  . Highest education level: Not on file  Occupational History  . Not on file  Tobacco Use  . Smoking status: Former Smoker    Quit date: 06/19/2017    Years since quitting: 2.7  . Smokeless tobacco: Never Used  Vaping Use  . Vaping Use: Former  Substance and Sexual Activity  . Alcohol use: Yes    Comment: on occasion  . Drug use: No  . Sexual activity: Yes  Other Topics Concern  . Not on file  Social History Narrative  . Not on file   Social Determinants of Health   Financial Resource Strain:   . Difficulty of Paying Living Expenses:   Food Insecurity:   . Worried About Programme researcher, broadcasting/film/video in the Last Year:   . Barista in the Last Year:   Transportation Needs:   . Freight forwarder (Medical):   Marland Kitchen Lack of Transportation (Non-Medical):   Physical Activity:   . Days of Exercise per Week:   . Minutes of Exercise per Session:   Stress:   . Feeling of Stress :   Social Connections:   . Frequency of Communication with Friends and Family:   . Frequency of Social Gatherings with Friends and Family:   . Attends Religious Services:   . Active Member of Clubs or Organizations:   . Attends Banker Meetings:   Marland Kitchen Marital Status:       Family History: The patient's family history includes Anxiety disorder in his sister; Cancer in his paternal uncle; Dementia in his father; Fibromyalgia in his sister and sister; Heart disease in his mother; Hypertension in his mother, sister, and sister; Prostate cancer in his paternal uncle; Stroke in his paternal grandmother.  ROS:   Please see the history of present illness.     All other systems reviewed and are negative.  EKGs/Labs/Other Studies Reviewed:    EKG:  EKG is not ordered today.  01/31/2020: Sinus rhythm.  Rate 64 bpm.  Left axis deviation.  Nonspecific T wave abnormalities.  Recent Labs: 01/29/2020: B Natriuretic Peptide 25.4; Magnesium 1.8 01/30/2020: Hemoglobin 14.5; Platelets 209 02/12/2020: ALT 11; BUN 8; Creatinine, Ser 0.96; Potassium 4.5; Sodium 141; TSH  0.590   Recent Lipid Panel    Component Value Date/Time   CHOL 181 01/30/2020 0456   CHOL 226 (H) 01/25/2019 0920   TRIG 42 01/30/2020 0456   HDL 38 (L) 01/30/2020 0456   HDL 36 (L) 01/25/2019 0920   CHOLHDL 4.8 01/30/2020 0456   VLDL 8 01/30/2020 0456   LDLCALC 135 (H) 01/30/2020 0456   LDLCALC 171 (H) 01/25/2019 0920    Physical Exam:    VS:  BP (!) 146/90   Pulse 81   Ht 5\' 6"  (1.676 m)   Wt 187 lb (84.8 kg)   SpO2 96%   BMI 30.18 kg/m  , BMI Body mass index is 30.18 kg/m. GENERAL:  Well appearing HEENT: Pupils equal round and reactive, fundi not visualized, oral mucosa unremarkable NECK:  No jugular venous distention, waveform within normal limits, carotid upstroke brisk and symmetric, no bruits, no thyromegaly LYMPHATICS:  No cervical adenopathy LUNGS:  Clear to auscultation bilaterally HEART:  RRR.  PMI not displaced or sustained,S1 and S2 within normal limits, no S3, no S4, no clicks, no rubs, no murmurs ABD:  Flat, positive bowel sounds normal in frequency in pitch, no bruits, no rebound, no guarding, no midline pulsatile mass, no hepatomegaly, no splenomegaly EXT:  2 plus pulses  throughout, no edema, no cyanosis no clubbing SKIN:  No rashes no nodules NEURO:  Cranial nerves II through XII grossly intact, motor grossly intact throughout PSYCH:  Cognitively intact, oriented to person place and time  ASSESSMENT:    1. Resistant hypertension   2. Essential hypertension   3. Paroxysmal SVT (supraventricular tachycardia) (HCC)   4. Palpitations   5. Shortness of breath     PLAN:    # Essential hypertension:  Mr. Hupp blood pressure remains poorly controlled.  He has intolerance to several medications.  Given this, we will go slow with any medication changes.  His PCP recently recommended switching from hydrochlorothiazide to chlorthalidone.  I think this is very reasonable.  Switch to 50 mg of chlorthalidone as suggested.  Check a basic metabolic panel in a week.  We will also round out his work-up of secondary causes.  We will check plasma renin and aldosterone to assess for hyperaldosteronism.  We will also check plasma metanephrines to assess for a pheochromocytoma given his tachycardia, headaches, and other symptoms.  Once this is better controlled hopefully he will be able to begin exercising again.  His diet has been excellent.  It seems that metoprolol was at least helpful with controlling his palpitations.  The carvedilol that Dr. Norval Gable recommended it is quite reasonable but we will consider this in the future.  We also discussed spironolactone today as a possibility.  Secondary Causes of Hypertension  Medications/Herbal: OCP, steroids, stimulants, antidepressants, weight loss medication, immune suppressants, NSAIDs-rare, sympathomimetics, alcohol-rare, caffeine, licorice, ginseng, St. John's wort, chemo (otherwise no unless bolded)  Sleep Apnea- (sleep study pending) Renal artery stenosis -Dopplers negative 09/2017 Hyperaldosteronism- check as above Hyper/hypothyroidism-TSH normal 01/2020 Pheochromocytoma: (check plasma metanephrines) Cushing's syndrome:  (testing not indicated) Coarctation of the aorta (BP symmetric)   Disposition:    FU with MD/PharmD in 1 month  Time spent: 40 minutes-Greater than 50% of this time was spent in counseling, explanation of diagnosis, planning of further management, and coordination of care.   Medication Adjustments/Labs and Tests Ordered: Current medicines are reviewed at length with the patient today.  Concerns regarding medicines are outlined above.  Orders Placed This Encounter  Procedures  . Aldosterone +  renin activity w/ ratio  . Basic metabolic panel  . Metanephrines, plasma   No orders of the defined types were placed in this encounter.    Signed, Chilton Si, MD  03/12/2020 12:34 PM    [facility]

## 2020-03-16 DIAGNOSIS — K5900 Constipation, unspecified: Secondary | ICD-10-CM | POA: Diagnosis not present

## 2020-03-16 DIAGNOSIS — R519 Headache, unspecified: Secondary | ICD-10-CM | POA: Diagnosis not present

## 2020-03-16 DIAGNOSIS — I10 Essential (primary) hypertension: Secondary | ICD-10-CM | POA: Diagnosis not present

## 2020-03-16 DIAGNOSIS — R002 Palpitations: Secondary | ICD-10-CM | POA: Diagnosis not present

## 2020-03-19 DIAGNOSIS — I10 Essential (primary) hypertension: Secondary | ICD-10-CM | POA: Diagnosis not present

## 2020-03-19 DIAGNOSIS — Z125 Encounter for screening for malignant neoplasm of prostate: Secondary | ICD-10-CM | POA: Diagnosis not present

## 2020-03-23 ENCOUNTER — Telehealth: Payer: Self-pay

## 2020-03-23 NOTE — Telephone Encounter (Signed)
-----   Message from Sondra Barges, [place] sent at 03/23/2020  7:13 AM EDT ----- Heart monitor showed 4 brief episodes of known SVT with the longest interval lasting 12 beats. Patient triggered events corresponded with PVCs. No changes in medical therapy given multiple medication intolerances and overall reassuring monitor. Follow up with the hypertension clinic as directed.

## 2020-03-23 NOTE — Telephone Encounter (Signed)
Call to patient to review zio monitor results.    Pt verbalized understanding and has no further questions at this time.    Advised pt to call for any further questions or concerns.  No further orders.   

## 2020-04-02 ENCOUNTER — Other Ambulatory Visit: Payer: Self-pay | Admitting: Family Medicine

## 2020-04-03 ENCOUNTER — Telehealth: Payer: Self-pay | Admitting: *Deleted

## 2020-04-03 DIAGNOSIS — I10 Essential (primary) hypertension: Secondary | ICD-10-CM

## 2020-04-03 DIAGNOSIS — R002 Palpitations: Secondary | ICD-10-CM

## 2020-04-03 NOTE — Telephone Encounter (Signed)
-----   Message from Chilton Si, MD sent at 04/03/2020 10:55 AM EDT ----- Labs reviewed.  He does not have hyperaldosteronism.  Labs were inconclusive for pheochromocytoma.  Please do a 24 hour urine collection for catecholamines.  Start it when he is having a spell of palpitations and headaches.

## 2020-04-08 ENCOUNTER — Encounter: Payer: Self-pay | Admitting: Family Medicine

## 2020-04-16 NOTE — Telephone Encounter (Signed)
Left message to call back  

## 2020-04-17 NOTE — Telephone Encounter (Signed)
Patient is returning Clarence Ray's call. Please call back.  

## 2020-04-20 NOTE — Telephone Encounter (Signed)
Patient is following up. 

## 2020-04-20 NOTE — Telephone Encounter (Signed)
Pt aware of recommendations and agrees with plan ./cy 

## 2020-04-22 DIAGNOSIS — I10 Essential (primary) hypertension: Secondary | ICD-10-CM | POA: Diagnosis not present

## 2020-04-23 ENCOUNTER — Other Ambulatory Visit: Payer: Self-pay

## 2020-04-23 ENCOUNTER — Ambulatory Visit (INDEPENDENT_AMBULATORY_CARE_PROVIDER_SITE_OTHER): Payer: BLUE CROSS/BLUE SHIELD | Admitting: Pharmacist

## 2020-04-23 VITALS — BP 134/82 | HR 72 | Resp 16 | Ht 66.0 in | Wt 187.0 lb

## 2020-04-23 DIAGNOSIS — I471 Supraventricular tachycardia: Secondary | ICD-10-CM | POA: Diagnosis not present

## 2020-04-23 DIAGNOSIS — I10 Essential (primary) hypertension: Secondary | ICD-10-CM | POA: Diagnosis not present

## 2020-04-23 NOTE — Progress Notes (Signed)
Patient ID: Clarence E Fate                 DOB: 08/22/1976                      MRN: [id number]     HPI: Clarence Ray is a 44 y.o. male referred by Dr. Duke Salvia to HTN clinic. PMH is significant for HTN and tachycardia.  Has had multiple medication changes recently and is now taking chlorthalidone 50 mg daily, diltiazem 360mg  daily, and hydralazine 50 mg BID.  Patient presents today with blood pressure log.  Reports adverse effects, possibly due to medications.  Reports he has had urinary hesitancy since starting hydralazine, and headaches since starting chlorthalidone.  Reports lack of energy and is frequently tired but thinks this is unrelated to medication.  Blood pressure log from past week:  8/26: 122/70 8/25: 123/67, 130/78 8/24: 125/67, 122/73 8/23: 120/71 8/22: 122/78 8/21: 123/72 8/20: 120/71, 129/83  Current HTN meds: chlorthalidone 50 mg, diltiazem 360 mg daily, hydralazine 50 mg BID Previously tried: metoprolol, HCTZ, amlodipine BP goal: <130/80  Wt Readings from Last 3 Encounters:  03/12/20 187 lb (84.8 kg)  02/12/20 190 lb (86.2 kg)  02/06/20 186 lb 4 oz (84.5 kg)   BP Readings from Last 3 Encounters:  03/12/20 (!) 146/90  02/12/20 131/83  02/06/20 124/80   Pulse Readings from Last 3 Encounters:  03/12/20 81  02/12/20 71  02/06/20 78    Renal function: CrCl cannot be calculated (Patient's most recent lab result is older than the maximum 21 days allowed.).  Past Medical History:  Diagnosis Date  . Anxiety   . Hyperlipidemia   . Hypertension   . SVT (supraventricular tachycardia) (HCC)     Current Outpatient Medications on File Prior to Visit  Medication Sig Dispense Refill  . albuterol (PROVENTIL HFA;VENTOLIN HFA) 108 (90 Base) MCG/ACT inhaler Inhale 2 puffs into the lungs every 6 (six) hours as needed for wheezing or shortness of breath. 1 Inhaler 2  . ALPRAZolam (XANAX) 0.25 MG tablet Take 1 tablet (0.25 mg total) by mouth daily as needed for  anxiety. 20 tablet 0  . chlorthalidone (HYGROTON) 50 MG tablet TAKE 1 TABLET(50 MG) BY MOUTH DAILY 90 tablet 0  . diltiazem (DILACOR XR) 180 MG 24 hr capsule Take 360 mg by mouth daily.    . pravastatin (PRAVACHOL) 40 MG tablet Take 1 tablet (40 mg total) by mouth daily. (Patient not taking: Reported on 03/12/2020) 90 tablet 1  . senna-docusate (SENOKOT-S) 8.6-50 MG tablet Take 1 tablet by mouth 2 (two) times daily. Hold for loose stools or diarrhea (Patient not taking: Reported on 03/12/2020) 60 tablet 1   No current facility-administered medications on file prior to visit.    Allergies  Allergen Reactions  . Atorvastatin Other (See Comments)    Made him "feel weird"  . Flomax [Tamsulosin] Other (See Comments)    Nausea, lightheaded, shakiness  . Losartan Other (See Comments)    Made him "feel weird"     Assessment/Plan:  1. Hypertension - Patient BP in room today 134/82, recheck 129/76, which is at goal of <130/80.  Patient reporting odd side effects to medications, however blood pressure is controlled.  Per Dr 03/14/2020 last note, consider addition of carvedilol and/or spironolactone.  However with BP at goal, patient is currently well managed on chlorthalidone, diltiazem, and hydralazine.  Will have patient continue to monitor how home BP and also when he feels  side effects such as headache or urinary hesitancy.  Will recheck in 1 month.  Laural Golden, PharmD, BCACP, CDCES [facility] [address] [place], [place] [address] Phone: [phone]; Fax: [phone] 04/23/2020 4:41 PM

## 2020-05-10 ENCOUNTER — Other Ambulatory Visit: Payer: Self-pay | Admitting: Family Medicine

## 2020-05-10 NOTE — Telephone Encounter (Signed)
Requested medication (s) are due for refill today: -  Requested medication (s) are on the active medication list: historical med   Last refill:  04/23/20  Future visit scheduled: no  Notes to clinic:  historical provider   Requested Prescriptions  Pending Prescriptions Disp Refills   hydrALAZINE (APRESOLINE) 50 MG tablet [Pharmacy Med Name: HYDRALAZINE HCL 50 MG TAB] 180 tablet     Sig: TAKE 1 TABLET BY MOUTH TWICE DAILY AS NEEDED      Cardiovascular:  Vasodilators Passed - 05/10/2020  1:38 PM      Passed - HCT in normal range and within 360 days    HCT  Date Value Ref Range Status  01/30/2020 42.1 39 - 52 % Final   Hematocrit  Date Value Ref Range Status  12/06/2019 47.1 37.5 - 51.0 % Final          Passed - HGB in normal range and within 360 days    Hemoglobin  Date Value Ref Range Status  01/30/2020 14.5 13.0 - 17.0 g/dL Final  04/54/0981 19.1 13.0 - 17.7 g/dL Final          Passed - RBC in normal range and within 360 days    RBC  Date Value Ref Range Status  01/30/2020 4.83 4.22 - 5.81 MIL/uL Final          Passed - WBC in normal range and within 360 days    WBC  Date Value Ref Range Status  01/30/2020 7.4 4.0 - 10.5 K/uL Final          Passed - PLT in normal range and within 360 days    Platelets  Date Value Ref Range Status  01/30/2020 209 150 - 400 K/uL Final  12/06/2019 251 150 - 450 x10E3/uL Final          Passed - Last BP in normal range    BP Readings from Last 1 Encounters:  04/23/20 134/82          Passed - Valid encounter within last 12 months    Recent Outpatient Visits           2 months ago Essential hypertension   [facility] Particia Nearing, [place]   3 months ago Essential hypertension   [facility] [place] Maser [place], [place]   4 months ago Decreased urine stream   [facility] [place], [place], [place]   5 months ago Essential hypertension   [facility] Roosvelt Maser [place], [place]   10 months ago Palpitations   [facility] [place], [place], [place]

## 2020-05-11 ENCOUNTER — Other Ambulatory Visit: Payer: Self-pay

## 2020-05-11 NOTE — Telephone Encounter (Signed)
Previous RL patient. Last seen 02/22/20.

## 2020-05-11 NOTE — Telephone Encounter (Signed)
We didn't write this- looks like it's from his cardiologist.

## 2020-05-11 NOTE — Telephone Encounter (Signed)
Last seen 02/22/20

## 2020-05-18 DIAGNOSIS — I10 Essential (primary) hypertension: Secondary | ICD-10-CM | POA: Diagnosis not present

## 2020-05-19 DIAGNOSIS — I10 Essential (primary) hypertension: Secondary | ICD-10-CM | POA: Diagnosis not present

## 2020-05-19 DIAGNOSIS — R5383 Other fatigue: Secondary | ICD-10-CM | POA: Diagnosis not present

## 2020-05-19 DIAGNOSIS — R0683 Snoring: Secondary | ICD-10-CM | POA: Diagnosis not present

## 2020-05-19 DIAGNOSIS — K5900 Constipation, unspecified: Secondary | ICD-10-CM | POA: Diagnosis not present

## 2020-05-25 DIAGNOSIS — I10 Essential (primary) hypertension: Secondary | ICD-10-CM | POA: Diagnosis not present

## 2020-06-03 DIAGNOSIS — R232 Flushing: Secondary | ICD-10-CM | POA: Diagnosis not present

## 2020-06-19 DIAGNOSIS — E7800 Pure hypercholesterolemia, unspecified: Secondary | ICD-10-CM | POA: Diagnosis not present

## 2020-06-19 DIAGNOSIS — R5383 Other fatigue: Secondary | ICD-10-CM | POA: Diagnosis not present

## 2020-06-19 DIAGNOSIS — I10 Essential (primary) hypertension: Secondary | ICD-10-CM | POA: Diagnosis not present

## 2020-06-19 DIAGNOSIS — Z23 Encounter for immunization: Secondary | ICD-10-CM | POA: Diagnosis not present

## 2020-06-24 DIAGNOSIS — G4710 Hypersomnia, unspecified: Secondary | ICD-10-CM | POA: Diagnosis not present

## 2020-06-25 DIAGNOSIS — G4710 Hypersomnia, unspecified: Secondary | ICD-10-CM | POA: Diagnosis not present

## 2020-06-29 ENCOUNTER — Other Ambulatory Visit: Payer: Self-pay | Admitting: Family Medicine

## 2020-06-29 NOTE — Telephone Encounter (Signed)
Unable to determine if this refill request is appropriate. Last ordered by Clarence Ray and no future appointment noted. Note in chart suggest he has a new pcp elsewhere.   Note to clinic-routing for consideration.

## 2020-06-29 NOTE — Telephone Encounter (Signed)
Patient last seen in June by RL. Overdue for follow up. Routing to admin for follow up and to provider for possible refill.

## 2020-07-20 DIAGNOSIS — G4700 Insomnia, unspecified: Secondary | ICD-10-CM | POA: Diagnosis not present

## 2020-07-20 DIAGNOSIS — E7800 Pure hypercholesterolemia, unspecified: Secondary | ICD-10-CM | POA: Diagnosis not present

## 2020-07-20 DIAGNOSIS — Z6829 Body mass index (BMI) 29.0-29.9, adult: Secondary | ICD-10-CM | POA: Diagnosis not present

## 2020-07-20 DIAGNOSIS — I10 Essential (primary) hypertension: Secondary | ICD-10-CM | POA: Diagnosis not present

## 2020-08-24 ENCOUNTER — Other Ambulatory Visit: Payer: Self-pay

## 2020-08-24 DIAGNOSIS — Z20822 Contact with and (suspected) exposure to covid-19: Secondary | ICD-10-CM | POA: Diagnosis not present

## 2020-08-25 LAB — SARS-COV-2, NAA 2 DAY TAT

## 2020-08-25 LAB — NOVEL CORONAVIRUS, NAA: SARS-CoV-2, NAA: NOT DETECTED

## 2020-08-26 ENCOUNTER — Telehealth: Payer: Self-pay | Admitting: General Practice

## 2020-08-26 NOTE — Telephone Encounter (Signed)
Negative COVID results given. Patient results "NOT Detected." Caller expressed understanding. ° °

## 2020-10-02 DIAGNOSIS — Z6829 Body mass index (BMI) 29.0-29.9, adult: Secondary | ICD-10-CM | POA: Diagnosis not present

## 2020-10-02 DIAGNOSIS — I10 Essential (primary) hypertension: Secondary | ICD-10-CM | POA: Diagnosis not present

## 2020-10-02 DIAGNOSIS — E876 Hypokalemia: Secondary | ICD-10-CM | POA: Diagnosis not present

## 2020-10-02 DIAGNOSIS — E7800 Pure hypercholesterolemia, unspecified: Secondary | ICD-10-CM | POA: Diagnosis not present

## 2021-01-05 DIAGNOSIS — R002 Palpitations: Secondary | ICD-10-CM | POA: Diagnosis not present

## 2021-01-05 DIAGNOSIS — Z6828 Body mass index (BMI) 28.0-28.9, adult: Secondary | ICD-10-CM | POA: Diagnosis not present

## 2021-01-05 DIAGNOSIS — I10 Essential (primary) hypertension: Secondary | ICD-10-CM | POA: Diagnosis not present

## 2021-01-05 DIAGNOSIS — Z0000 Encounter for general adult medical examination without abnormal findings: Secondary | ICD-10-CM | POA: Diagnosis not present

## 2021-01-16 DIAGNOSIS — R300 Dysuria: Secondary | ICD-10-CM | POA: Diagnosis not present

## 2021-02-12 DIAGNOSIS — I10 Essential (primary) hypertension: Secondary | ICD-10-CM | POA: Diagnosis not present

## 2021-02-12 DIAGNOSIS — Z131 Encounter for screening for diabetes mellitus: Secondary | ICD-10-CM | POA: Diagnosis not present

## 2021-02-12 DIAGNOSIS — Z1159 Encounter for screening for other viral diseases: Secondary | ICD-10-CM | POA: Diagnosis not present

## 2021-02-12 DIAGNOSIS — Z13220 Encounter for screening for lipoid disorders: Secondary | ICD-10-CM | POA: Diagnosis not present

## 2021-02-15 DIAGNOSIS — H1032 Unspecified acute conjunctivitis, left eye: Secondary | ICD-10-CM | POA: Diagnosis not present

## 2021-02-26 IMAGING — DX DG CHEST 1V PORT
1 series · 1 of 1 positions shown · non-contrast
Comparison: None.

CLINICAL DATA: Chest pain following exercise

EXAM:
PORTABLE CHEST 1 VIEW

[chest ap]
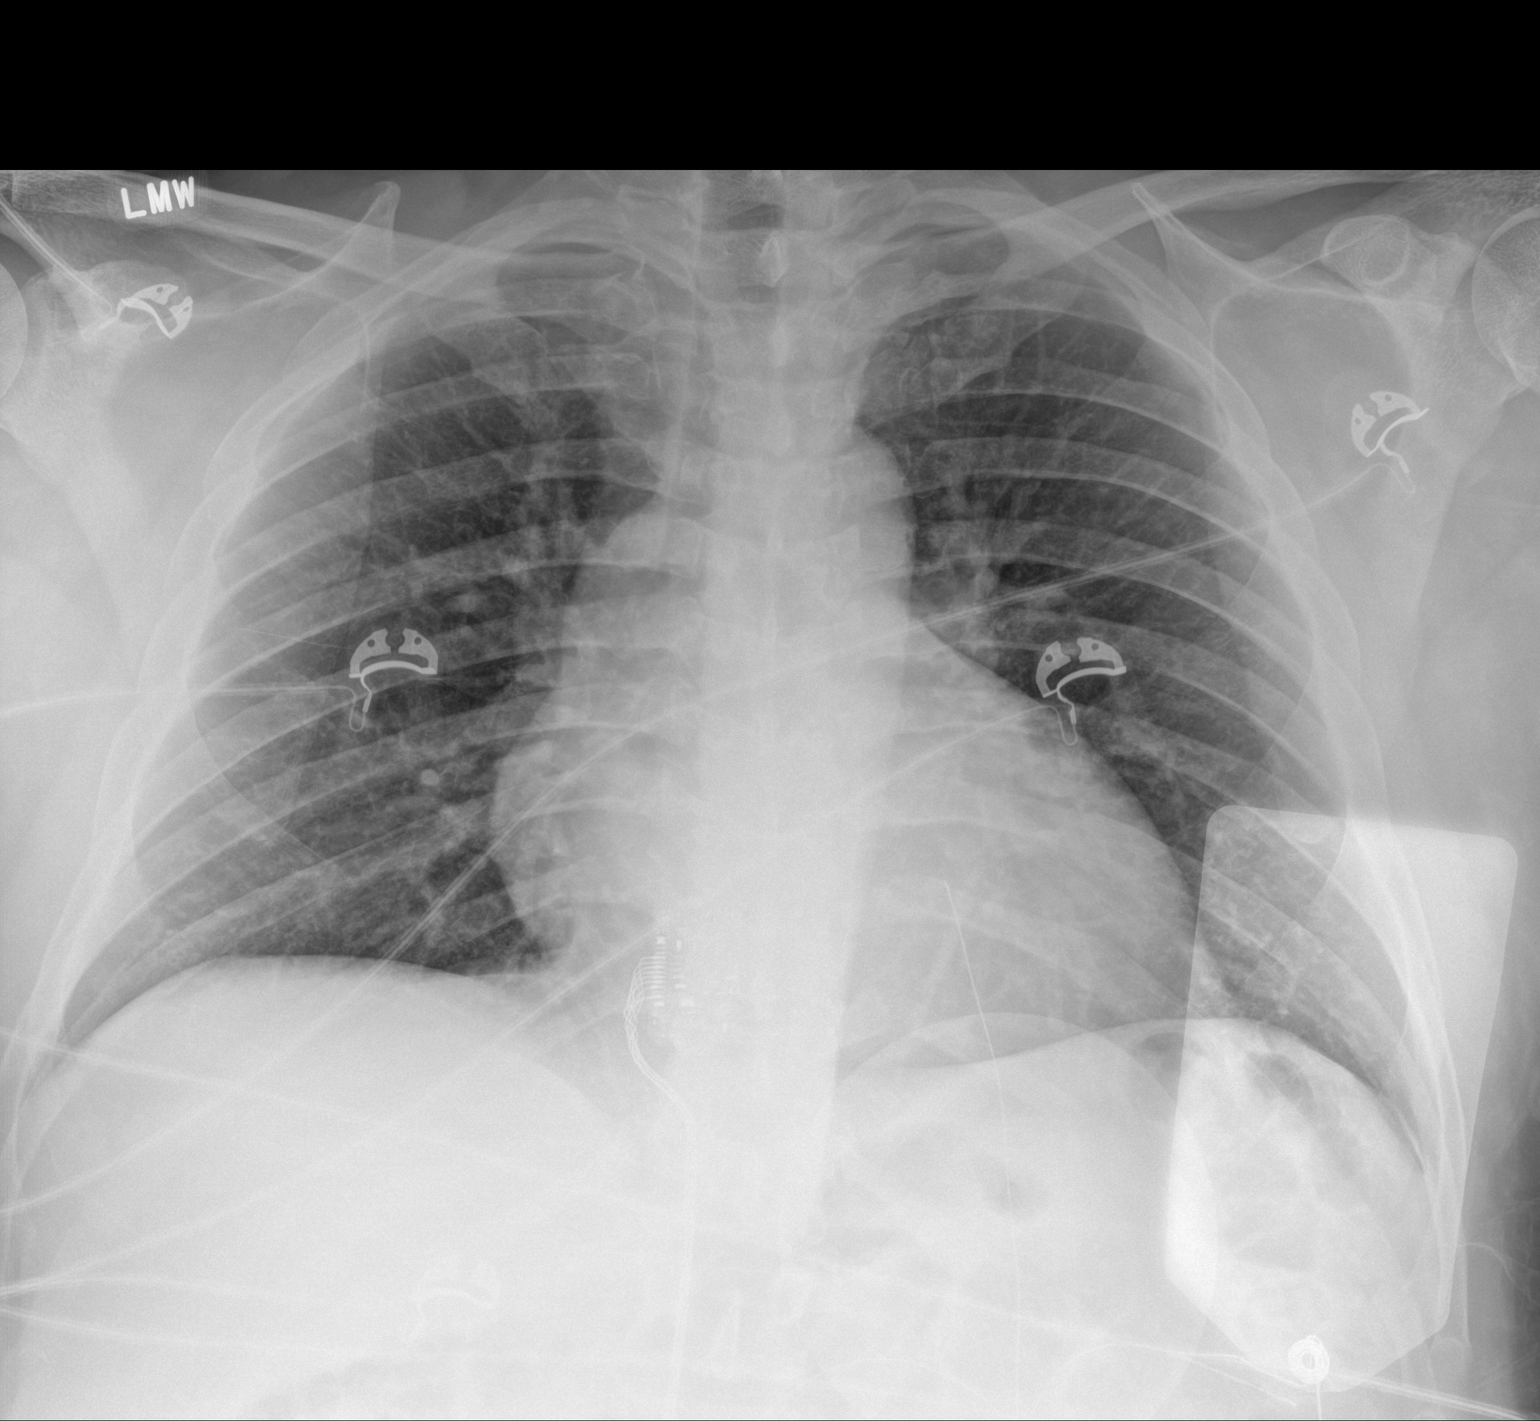

[1 of 1 positions shown; findings below may reference images not displayed]

FINDINGS: The heart size and mediastinal contours are within normal limits.
Both lungs are clear. The visualized skeletal structures are
unremarkable.
IMPRESSION: No active disease.

## 2021-02-26 IMAGING — CT CT ANGIO CHEST
2 of 6 series · 18 of 46 positions shown · IV contrast (APPLIED)
Comparison: Chest x-ray from earlier in the same day.

CLINICAL DATA: Chest tightness following exercise, initial
encounter

EXAM:
CT ANGIOGRAPHY CHEST WITH CONTRAST
TECHNIQUE: Multidetector CT imaging of the chest was performed using the
standard protocol during bolus administration of intravenous
contrast. Multiplanar CT image reconstructions and MIPs were
obtained to evaluate the vascular anatomy.
CONTRAST:  100mL OMNIPAQUE IOHEXOL 350 MG/ML SOLN

[Series 6: thins · axial · 0.75mm/px · z∈[+909,+1126]mm · 15 of 239 slices shown]
[im 11/239  lung]
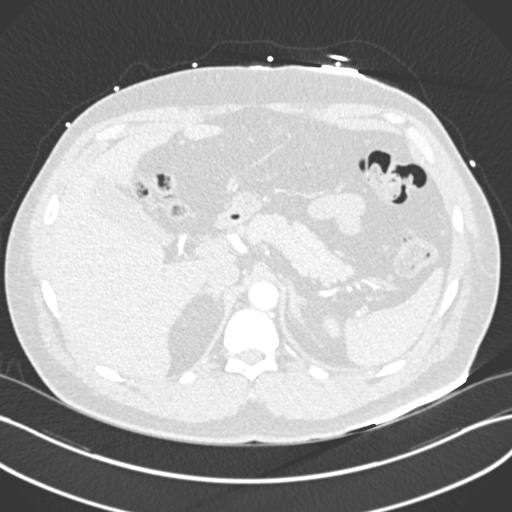
[im 32/239  soft-tissue]
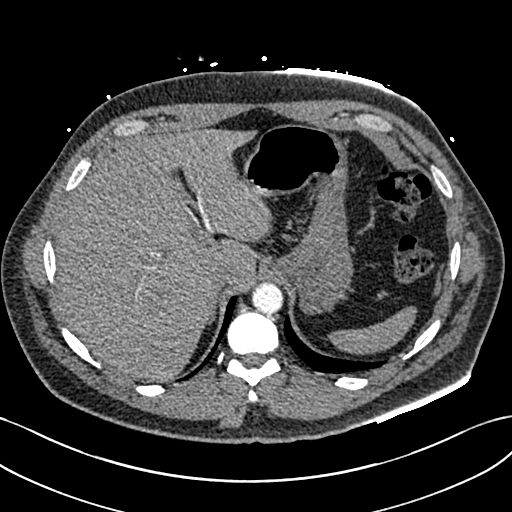
[im 42/239  lung]
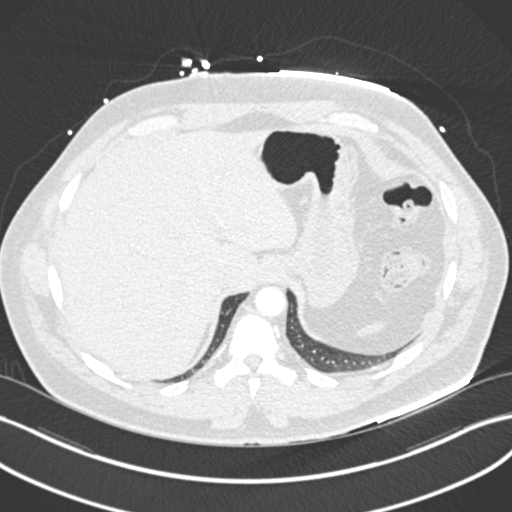
[im 63/239  soft-tissue]
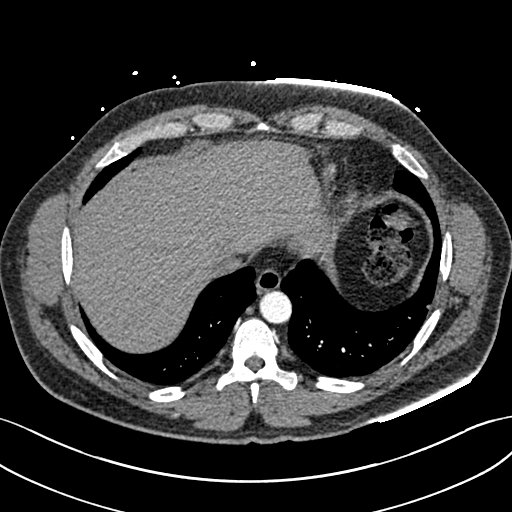
[im 73/239  lung]
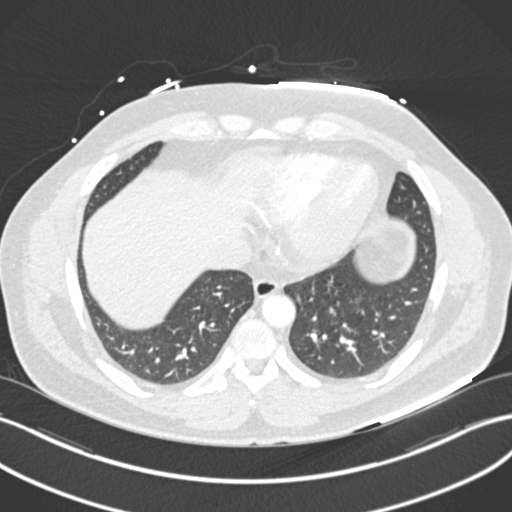
[im 94/239  soft-tissue]
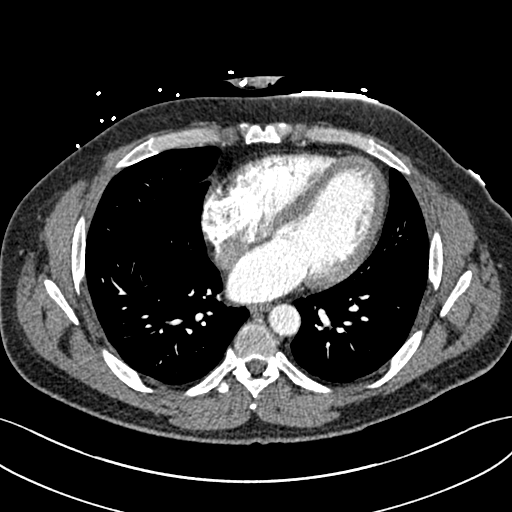
[im 104/239  lung]
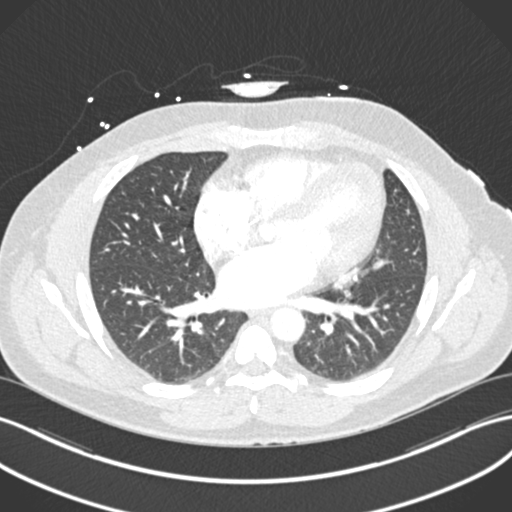
[im 125/239  soft-tissue]
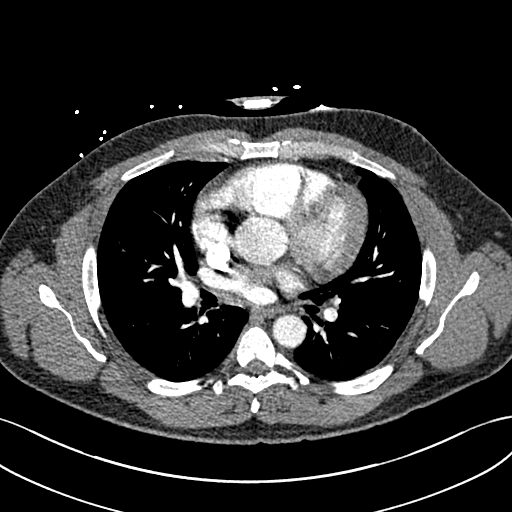
[im 135/239  lung]
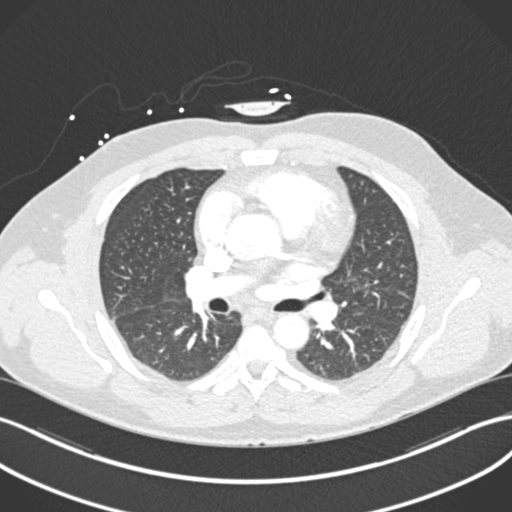
[im 145/239  soft-tissue]
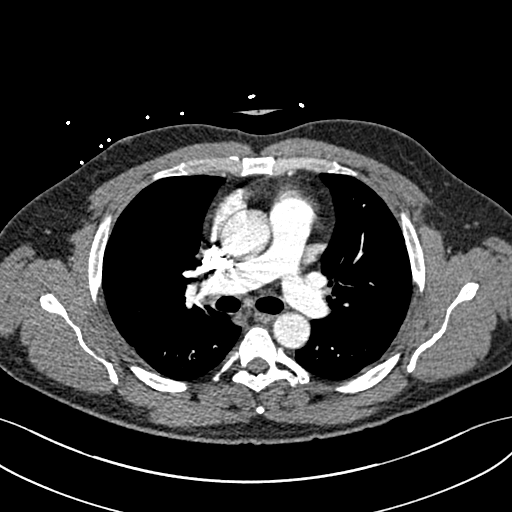
[im 166/239  lung]
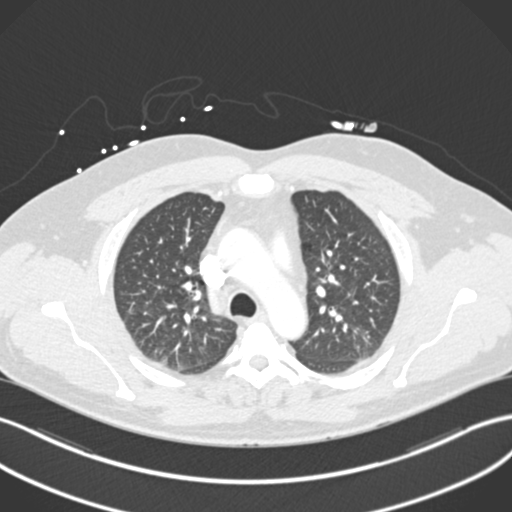
[im 176/239  soft-tissue]
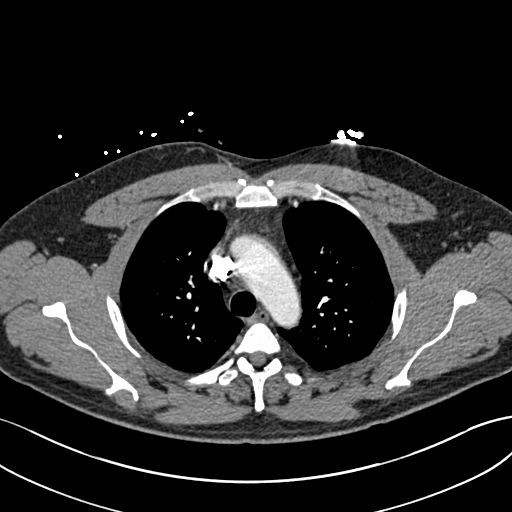
[im 197/239  lung]
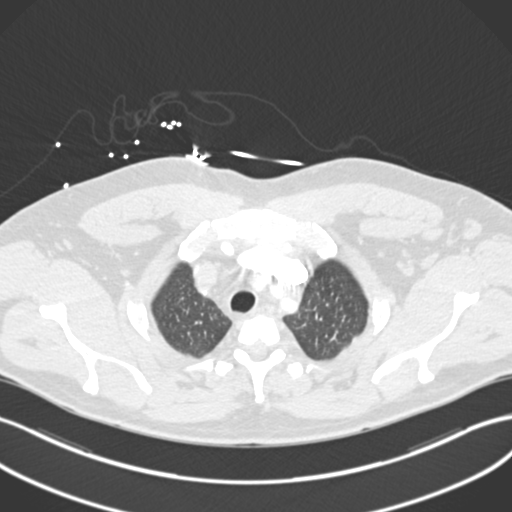
[im 207/239  soft-tissue]
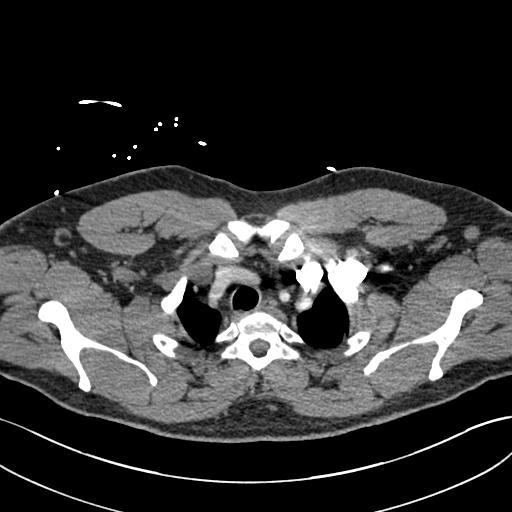
[im 228/239  lung]
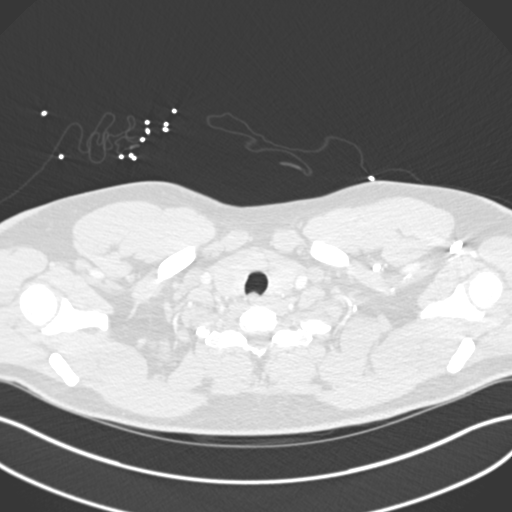

[Series 8: coronal mpr · coronal · 0.53mm/px · 3 of 81 slices shown]
[im 21/81  soft-tissue]
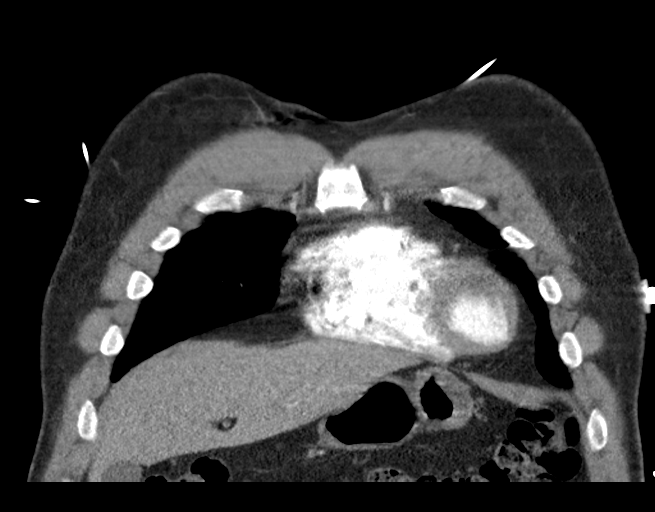
[im 41/81  soft-tissue]
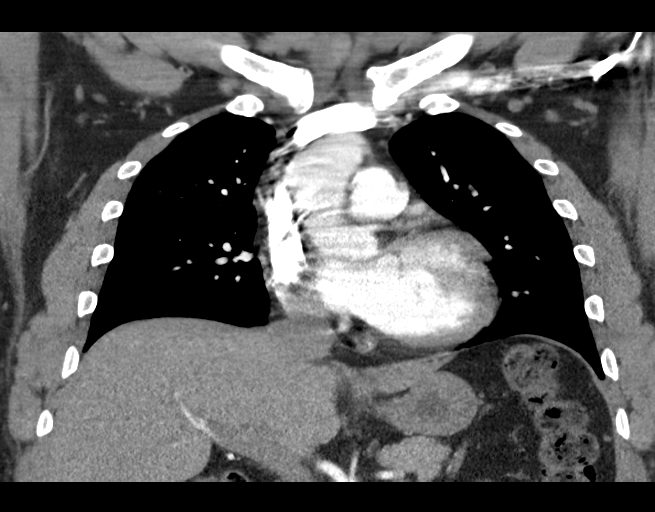
[im 61/81  soft-tissue]
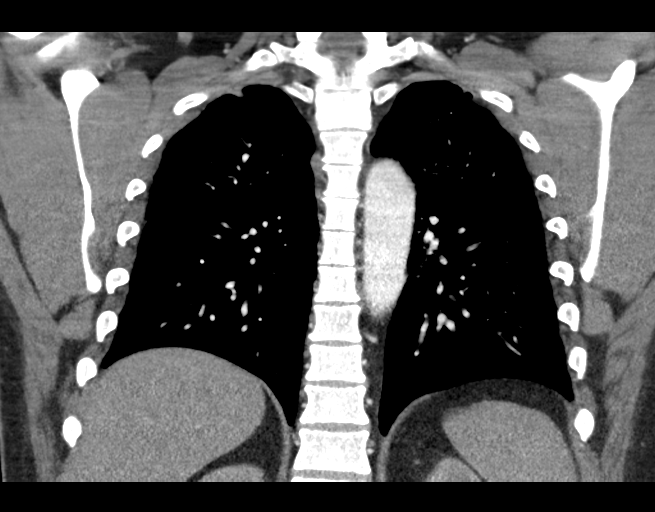

[18 of 46 positions shown; findings below may reference images not displayed]

FINDINGS: Cardiovascular: Thoracic aorta shows a normal branching pattern. No
cardiac enlargement is noted. No aneurysmal dilatation or dissection
is seen. No coronary calcifications are noted. The pulmonary artery
shows a normal branching pattern without definitive filling defect
to suggest pulmonary embolism.

Mediastinum/Nodes: Thoracic inlet is within normal limits. No hilar
or mediastinal adenopathy is noted. The esophagus is unremarkable.

Lungs/Pleura: The lungs are well aerated bilaterally. No focal
infiltrate or sizable effusion is seen. No sizable parenchymal
nodules are noted.

Upper Abdomen: Visualized upper abdomen is unremarkable.

Musculoskeletal: No chest wall abnormality. No acute or significant
osseous findings.

Review of the MIP images confirms the above findings.
IMPRESSION: No evidence of pulmonary emboli.

No acute abnormality seen.

## 2021-04-13 DIAGNOSIS — F4010 Social phobia, unspecified: Secondary | ICD-10-CM | POA: Diagnosis not present

## 2021-04-20 DIAGNOSIS — F4010 Social phobia, unspecified: Secondary | ICD-10-CM | POA: Diagnosis not present

## 2021-04-27 DIAGNOSIS — F4010 Social phobia, unspecified: Secondary | ICD-10-CM | POA: Diagnosis not present

## 2021-05-05 DIAGNOSIS — F4010 Social phobia, unspecified: Secondary | ICD-10-CM | POA: Diagnosis not present

## 2021-05-11 DIAGNOSIS — F4010 Social phobia, unspecified: Secondary | ICD-10-CM | POA: Diagnosis not present

## 2021-05-25 DIAGNOSIS — F4010 Social phobia, unspecified: Secondary | ICD-10-CM | POA: Diagnosis not present

## 2021-06-01 DIAGNOSIS — F4010 Social phobia, unspecified: Secondary | ICD-10-CM | POA: Diagnosis not present

## 2021-06-02 DIAGNOSIS — R002 Palpitations: Secondary | ICD-10-CM | POA: Diagnosis not present

## 2021-06-02 DIAGNOSIS — I10 Essential (primary) hypertension: Secondary | ICD-10-CM | POA: Diagnosis not present

## 2021-06-02 DIAGNOSIS — Z6826 Body mass index (BMI) 26.0-26.9, adult: Secondary | ICD-10-CM | POA: Diagnosis not present

## 2021-06-02 DIAGNOSIS — Z23 Encounter for immunization: Secondary | ICD-10-CM | POA: Diagnosis not present

## 2021-06-02 DIAGNOSIS — R109 Unspecified abdominal pain: Secondary | ICD-10-CM | POA: Diagnosis not present

## 2021-06-08 DIAGNOSIS — F4010 Social phobia, unspecified: Secondary | ICD-10-CM | POA: Diagnosis not present

## 2021-06-15 DIAGNOSIS — F4010 Social phobia, unspecified: Secondary | ICD-10-CM | POA: Diagnosis not present

## 2021-06-22 DIAGNOSIS — F4010 Social phobia, unspecified: Secondary | ICD-10-CM | POA: Diagnosis not present

## 2021-07-06 DIAGNOSIS — F4010 Social phobia, unspecified: Secondary | ICD-10-CM | POA: Diagnosis not present

## 2021-07-13 DIAGNOSIS — E876 Hypokalemia: Secondary | ICD-10-CM | POA: Diagnosis not present

## 2021-07-13 DIAGNOSIS — R946 Abnormal results of thyroid function studies: Secondary | ICD-10-CM | POA: Diagnosis not present

## 2021-07-13 DIAGNOSIS — Z6827 Body mass index (BMI) 27.0-27.9, adult: Secondary | ICD-10-CM | POA: Diagnosis not present

## 2021-07-15 DIAGNOSIS — R002 Palpitations: Secondary | ICD-10-CM | POA: Diagnosis not present

## 2021-07-15 DIAGNOSIS — Z6827 Body mass index (BMI) 27.0-27.9, adult: Secondary | ICD-10-CM | POA: Diagnosis not present

## 2021-07-15 DIAGNOSIS — E876 Hypokalemia: Secondary | ICD-10-CM | POA: Diagnosis not present

## 2021-07-15 DIAGNOSIS — I10 Essential (primary) hypertension: Secondary | ICD-10-CM | POA: Diagnosis not present

## 2021-07-27 DIAGNOSIS — F4010 Social phobia, unspecified: Secondary | ICD-10-CM | POA: Diagnosis not present

## 2021-07-30 DIAGNOSIS — I10 Essential (primary) hypertension: Secondary | ICD-10-CM | POA: Diagnosis not present

## 2021-07-30 DIAGNOSIS — E876 Hypokalemia: Secondary | ICD-10-CM | POA: Diagnosis not present

## 2021-08-10 DIAGNOSIS — F4010 Social phobia, unspecified: Secondary | ICD-10-CM | POA: Diagnosis not present

## 2021-08-17 DIAGNOSIS — F4010 Social phobia, unspecified: Secondary | ICD-10-CM | POA: Diagnosis not present
